# Patient Record
Sex: Male | Born: 1935 | Race: White | Hispanic: No | Smoking: Never smoker
Health system: Southern US, Community
[De-identification: ages and names within clinical notes are randomized; demographics above are authoritative.]

## PROBLEM LIST (undated history)

## (undated) DIAGNOSIS — J189 Pneumonia, unspecified organism: Secondary | ICD-10-CM

## (undated) DIAGNOSIS — Z950 Presence of cardiac pacemaker: Secondary | ICD-10-CM

## (undated) DIAGNOSIS — G4733 Obstructive sleep apnea (adult) (pediatric): Secondary | ICD-10-CM

## (undated) DIAGNOSIS — Z9981 Dependence on supplemental oxygen: Secondary | ICD-10-CM

## (undated) DIAGNOSIS — Z9989 Dependence on other enabling machines and devices: Secondary | ICD-10-CM

## (undated) DIAGNOSIS — I10 Essential (primary) hypertension: Secondary | ICD-10-CM

## (undated) DIAGNOSIS — E7800 Pure hypercholesterolemia, unspecified: Secondary | ICD-10-CM

## (undated) DIAGNOSIS — I509 Heart failure, unspecified: Secondary | ICD-10-CM

## (undated) HISTORY — PX: CATARACT EXTRACTION, BILATERAL: SHX1313

---

## 1989-05-20 HISTORY — PX: BRAIN SURGERY: SHX531

## 1999-05-21 HISTORY — PX: INSERT / REPLACE / REMOVE PACEMAKER: SUR710

## 2005-11-21 ENCOUNTER — Ambulatory Visit: Payer: Self-pay | Admitting: Gastroenterology

## 2009-05-25 ENCOUNTER — Inpatient Hospital Stay: Payer: Self-pay | Admitting: Student

## 2009-06-02 ENCOUNTER — Encounter: Payer: Self-pay | Admitting: Internal Medicine

## 2010-03-02 ENCOUNTER — Ambulatory Visit: Payer: Self-pay | Admitting: Cardiology

## 2010-03-19 ENCOUNTER — Ambulatory Visit: Payer: Self-pay | Admitting: Cardiology

## 2010-04-19 ENCOUNTER — Ambulatory Visit: Payer: Self-pay | Admitting: Cardiology

## 2012-05-25 ENCOUNTER — Other Ambulatory Visit: Payer: Self-pay | Admitting: Ophthalmology

## 2012-05-28 LAB — AEROBIC CULTURE

## 2012-06-15 LAB — CULTURE, FUNGUS WITHOUT SMEAR

## 2013-03-06 ENCOUNTER — Inpatient Hospital Stay: Payer: Self-pay | Admitting: Internal Medicine

## 2013-03-06 LAB — CBC
HCT: 31.9 % — ABNORMAL LOW (ref 40.0–52.0)
MCHC: 34.8 g/dL (ref 32.0–36.0)
MCV: 102 fL — ABNORMAL HIGH (ref 80–100)
RDW: 13.8 % (ref 11.5–14.5)
WBC: 13.6 10*3/uL — ABNORMAL HIGH (ref 3.8–10.6)

## 2013-03-06 LAB — COMPREHENSIVE METABOLIC PANEL
Albumin: 3.8 g/dL (ref 3.4–5.0)
Alkaline Phosphatase: 88 U/L (ref 50–136)
Chloride: 102 mmol/L (ref 98–107)
EGFR (Non-African Amer.): 24 — ABNORMAL LOW
Glucose: 100 mg/dL — ABNORMAL HIGH (ref 65–99)
Osmolality: 285 (ref 275–301)
Potassium: 3.7 mmol/L (ref 3.5–5.1)

## 2013-03-06 LAB — URINALYSIS, COMPLETE
Bacteria: NONE SEEN
Blood: NEGATIVE
Glucose,UR: NEGATIVE mg/dL (ref 0–75)
Leukocyte Esterase: NEGATIVE
Protein: NEGATIVE
RBC,UR: <1 /HPF (ref 0–5)
Specific Gravity: 1.014 (ref 1.003–1.030)

## 2013-03-07 LAB — CBC WITH DIFFERENTIAL/PLATELET
Eosinophil #: 0 10*3/uL (ref 0.0–0.7)
Eosinophil %: 0.3 %
HCT: 27.1 % — ABNORMAL LOW (ref 40.0–52.0)
Lymphocyte %: 12.7 %
MCH: 35.5 pg — ABNORMAL HIGH (ref 26.0–34.0)
MCV: 103 fL — ABNORMAL HIGH (ref 80–100)
Monocyte #: 1.3 x10 3/mm — ABNORMAL HIGH (ref 0.2–1.0)
Monocyte %: 13.2 %
Neutrophil #: 7.2 10*3/uL — ABNORMAL HIGH (ref 1.4–6.5)
Neutrophil %: 73.3 %
RBC: 2.62 10*6/uL — ABNORMAL LOW (ref 4.40–5.90)

## 2013-03-07 LAB — BASIC METABOLIC PANEL
Anion Gap: 6 — ABNORMAL LOW (ref 7–16)
BUN: 43 mg/dL — ABNORMAL HIGH (ref 7–18)
Calcium, Total: 7.6 mg/dL — ABNORMAL LOW (ref 8.5–10.1)
Chloride: 107 mmol/L (ref 98–107)
Creatinine: 1.93 mg/dL — ABNORMAL HIGH (ref 0.60–1.30)
EGFR (African American): 35 — ABNORMAL LOW
EGFR (Non-African Amer.): 30 — ABNORMAL LOW
Glucose: 98 mg/dL (ref 65–99)

## 2013-03-07 LAB — LIPID PANEL
HDL Cholesterol: 36 mg/dL — ABNORMAL LOW (ref 40–60)
Ldl Cholesterol, Calc: 78 mg/dL (ref 0–100)
VLDL Cholesterol, Calc: 10 mg/dL (ref 5–40)

## 2013-03-07 LAB — TROPONIN I
Troponin-I: 0.05 ng/mL
Troponin-I: 0.05 ng/mL

## 2013-03-08 LAB — BASIC METABOLIC PANEL
Anion Gap: 3 — ABNORMAL LOW (ref 7–16)
BUN: 36 mg/dL — ABNORMAL HIGH (ref 7–18)
BUN: 31 mg/dL — ABNORMAL HIGH (ref 7–18)
Calcium, Total: 7.4 mg/dL — ABNORMAL LOW (ref 8.5–10.1)
Calcium, Total: 7.5 mg/dL — ABNORMAL LOW (ref 8.5–10.1)
Co2: 28 mmol/L (ref 21–32)
Creatinine: 1.76 mg/dL — ABNORMAL HIGH (ref 0.60–1.30)
EGFR (African American): 43 — ABNORMAL LOW
EGFR (Non-African Amer.): 33 — ABNORMAL LOW
EGFR (Non-African Amer.): 37 — ABNORMAL LOW
Glucose: 132 mg/dL — ABNORMAL HIGH (ref 65–99)
Potassium: 3.8 mmol/L (ref 3.5–5.1)
Potassium: 4.2 mmol/L (ref 3.5–5.1)
Sodium: 141 mmol/L (ref 136–145)

## 2013-03-29 LAB — CULTURE, BLOOD (SINGLE)

## 2013-10-21 ENCOUNTER — Ambulatory Visit: Payer: Self-pay | Admitting: Internal Medicine

## 2013-10-31 ENCOUNTER — Emergency Department: Payer: Self-pay | Admitting: Internal Medicine

## 2013-10-31 LAB — URINALYSIS, COMPLETE
BACTERIA: NONE SEEN
BILIRUBIN, UR: NEGATIVE
Blood: NEGATIVE
Glucose,UR: NEGATIVE mg/dL (ref 0–75)
KETONE: NEGATIVE
Leukocyte Esterase: NEGATIVE
Nitrite: NEGATIVE
Ph: 7 (ref 4.5–8.0)
Protein: 30
Specific Gravity: 1.006 (ref 1.003–1.030)
Squamous Epithelial: NONE SEEN

## 2013-10-31 LAB — CK TOTAL AND CKMB (NOT AT ARMC)
CK, Total: 77 U/L
CK-MB: 2.8 ng/mL (ref 0.5–3.6)

## 2013-10-31 LAB — CBC
HCT: 39.9 % — ABNORMAL LOW (ref 40.0–52.0)
HGB: 13.4 g/dL (ref 13.0–18.0)
MCH: 35.9 pg — ABNORMAL HIGH (ref 26.0–34.0)
MCHC: 33.5 g/dL (ref 32.0–36.0)
MCV: 107 fL — AB (ref 80–100)
PLATELETS: 125 10*3/uL — AB (ref 150–440)
RBC: 3.73 10*6/uL — ABNORMAL LOW (ref 4.40–5.90)
RDW: 14.6 % — ABNORMAL HIGH (ref 11.5–14.5)
WBC: 6.1 10*3/uL (ref 3.8–10.6)

## 2013-10-31 LAB — PROTIME-INR
INR: 1.4
Prothrombin Time: 16.6 secs — ABNORMAL HIGH (ref 11.5–14.7)

## 2013-10-31 LAB — COMPREHENSIVE METABOLIC PANEL
ALK PHOS: 86 U/L
ALT: 22 U/L (ref 12–78)
AST: 25 U/L (ref 15–37)
Albumin: 3.4 g/dL (ref 3.4–5.0)
Anion Gap: 4 — ABNORMAL LOW (ref 7–16)
BUN: 31 mg/dL — AB (ref 7–18)
Bilirubin,Total: 0.6 mg/dL (ref 0.2–1.0)
CO2: 36 mmol/L — AB (ref 21–32)
CREATININE: 1.63 mg/dL — AB (ref 0.60–1.30)
Calcium, Total: 8.1 mg/dL — ABNORMAL LOW (ref 8.5–10.1)
Chloride: 92 mmol/L — ABNORMAL LOW (ref 98–107)
EGFR (Non-African Amer.): 37 — ABNORMAL LOW
GFR CALC AF AMER: 42 — AB
GLUCOSE: 115 mg/dL — AB (ref 65–99)
Osmolality: 272 (ref 275–301)
Potassium: 4.4 mmol/L (ref 3.5–5.1)
Sodium: 132 mmol/L — ABNORMAL LOW (ref 136–145)
Total Protein: 6.4 g/dL (ref 6.4–8.2)

## 2013-10-31 LAB — TROPONIN I: TROPONIN-I: 0.03 ng/mL

## 2013-10-31 LAB — PRO B NATRIURETIC PEPTIDE: B-TYPE NATIURETIC PEPTID: 19139 pg/mL — AB (ref 0–450)

## 2013-11-17 ENCOUNTER — Ambulatory Visit: Payer: Self-pay | Admitting: Internal Medicine

## 2013-12-18 ENCOUNTER — Ambulatory Visit: Payer: Self-pay | Admitting: Internal Medicine

## 2014-01-12 ENCOUNTER — Inpatient Hospital Stay: Payer: Self-pay | Admitting: Internal Medicine

## 2014-01-12 LAB — URINALYSIS, COMPLETE
Bacteria: NONE SEEN
Bilirubin,UR: NEGATIVE
Blood: NEGATIVE
Glucose,UR: NEGATIVE mg/dL (ref 0–75)
KETONE: NEGATIVE
LEUKOCYTE ESTERASE: NEGATIVE
Nitrite: NEGATIVE
PH: 5 (ref 4.5–8.0)
SQUAMOUS EPITHELIAL: NONE SEEN
Specific Gravity: 1.014 (ref 1.003–1.030)
WBC UR: 1 /HPF (ref 0–5)

## 2014-01-12 LAB — COMPREHENSIVE METABOLIC PANEL
ALBUMIN: 2.8 g/dL — AB (ref 3.4–5.0)
ALT: 19 U/L (ref 12–78)
ANION GAP: 3 — AB (ref 7–16)
Alkaline Phosphatase: 90 U/L
BUN: 36 mg/dL — ABNORMAL HIGH (ref 7–18)
Bilirubin,Total: 0.5 mg/dL (ref 0.2–1.0)
CHLORIDE: 107 mmol/L (ref 98–107)
Calcium, Total: 7 mg/dL — CL (ref 8.5–10.1)
Co2: 37 mmol/L — ABNORMAL HIGH (ref 21–32)
Creatinine: 1.38 mg/dL — ABNORMAL HIGH (ref 0.60–1.30)
EGFR (African American): 51 — ABNORMAL LOW
EGFR (Non-African Amer.): 44 — ABNORMAL LOW
Glucose: 210 mg/dL — ABNORMAL HIGH (ref 65–99)
OSMOLALITY: 307 (ref 275–301)
POTASSIUM: 3.7 mmol/L (ref 3.5–5.1)
SGOT(AST): 27 U/L (ref 15–37)
SODIUM: 147 mmol/L — AB (ref 136–145)
Total Protein: 5.7 g/dL — ABNORMAL LOW (ref 6.4–8.2)

## 2014-01-12 LAB — PRO B NATRIURETIC PEPTIDE: B-Type Natriuretic Peptide: 22016 pg/mL — ABNORMAL HIGH (ref 0–450)

## 2014-01-12 LAB — DIFFERENTIAL
BASOS ABS: 0 10*3/uL (ref 0.0–0.1)
BASOS PCT: 0.6 %
EOS PCT: 0.1 %
Eosinophil #: 0 10*3/uL (ref 0.0–0.7)
LYMPHS ABS: 0.7 10*3/uL — AB (ref 1.0–3.6)
Lymphocyte %: 10.3 %
Monocyte #: 0.5 x10 3/mm (ref 0.2–1.0)
Monocyte %: 7.3 %
NEUTROS PCT: 81.7 %
Neutrophil #: 5.3 10*3/uL (ref 1.4–6.5)

## 2014-01-12 LAB — CBC
HCT: 42.7 % (ref 40.0–52.0)
HGB: 13.8 g/dL (ref 13.0–18.0)
MCH: 36.2 pg — ABNORMAL HIGH (ref 26.0–34.0)
MCHC: 32.4 g/dL (ref 32.0–36.0)
MCV: 112 fL — ABNORMAL HIGH (ref 80–100)
PLATELETS: 100 10*3/uL — AB (ref 150–440)
RBC: 3.83 10*6/uL — ABNORMAL LOW (ref 4.40–5.90)
RDW: 15.2 % — AB (ref 11.5–14.5)
WBC: 6.5 10*3/uL (ref 3.8–10.6)

## 2014-01-12 LAB — TROPONIN I
TROPONIN-I: 0.05 ng/mL
TROPONIN-I: 0.05 ng/mL
Troponin-I: 0.05 ng/mL
Troponin-I: 0.05 ng/mL

## 2014-01-12 LAB — TSH: THYROID STIMULATING HORM: 0.588 u[IU]/mL

## 2014-01-12 LAB — CARBAMAZEPINE LEVEL, TOTAL: Carbamazepine: 7.5 ug/mL (ref 4.0–12.0)

## 2014-01-13 LAB — CBC WITH DIFFERENTIAL/PLATELET
Basophil #: 0 10*3/uL (ref 0.0–0.1)
Basophil %: 0.4 %
Eosinophil #: 0.1 10*3/uL (ref 0.0–0.7)
Eosinophil %: 1.3 %
HCT: 40.7 % (ref 40.0–52.0)
HGB: 13.6 g/dL (ref 13.0–18.0)
LYMPHS PCT: 8 %
Lymphocyte #: 0.5 10*3/uL — ABNORMAL LOW (ref 1.0–3.6)
MCH: 36.6 pg — ABNORMAL HIGH (ref 26.0–34.0)
MCHC: 33.5 g/dL (ref 32.0–36.0)
MCV: 109 fL — ABNORMAL HIGH (ref 80–100)
MONO ABS: 0.6 x10 3/mm (ref 0.2–1.0)
Monocyte %: 9.6 %
Neutrophil #: 5.5 10*3/uL (ref 1.4–6.5)
Neutrophil %: 80.7 %
Platelet: 89 10*3/uL — ABNORMAL LOW (ref 150–440)
RBC: 3.73 10*6/uL — ABNORMAL LOW (ref 4.40–5.90)
RDW: 14.9 % — AB (ref 11.5–14.5)
WBC: 6.8 10*3/uL (ref 3.8–10.6)

## 2014-01-13 LAB — BASIC METABOLIC PANEL
Anion Gap: 1 — ABNORMAL LOW (ref 7–16)
BUN: 38 mg/dL — ABNORMAL HIGH (ref 7–18)
Calcium, Total: 7.9 mg/dL — ABNORMAL LOW (ref 8.5–10.1)
Chloride: 101 mmol/L (ref 98–107)
Co2: 42 mmol/L (ref 21–32)
Creatinine: 1.46 mg/dL — ABNORMAL HIGH (ref 0.60–1.30)
EGFR (Non-African Amer.): 41 — ABNORMAL LOW
GFR CALC AF AMER: 48 — AB
GLUCOSE: 97 mg/dL (ref 65–99)
Osmolality: 296 (ref 275–301)
POTASSIUM: 3.4 mmol/L — AB (ref 3.5–5.1)
SODIUM: 144 mmol/L (ref 136–145)

## 2014-01-14 LAB — LACTATE DEHYDROGENASE, PLEURAL OR PERITONEAL FLUID: LDH, BODY FLUID: 63 U/L

## 2014-01-14 LAB — BODY FLUID CELL COUNT WITH DIFFERENTIAL
Basophil: 0 %
Eosinophil: 0 %
Lymphocytes: 10 %
NEUTROS PCT: 72 %
NUCLEATED CELL COUNT: 155 /mm3
OTHER MONONUCLEAR CELLS: 18 %
Other Cells BF: 0 %

## 2014-01-14 LAB — APTT: Activated PTT: 32.3 secs (ref 23.6–35.9)

## 2014-01-14 LAB — ALBUMIN, FLUID (OTHER): Body Fluid Albumin: 1.4 g/dL

## 2014-01-14 LAB — AMYLASE, BODY FLUID: AMYLASE, BODY FLUID: 10 U/L

## 2014-01-14 LAB — PROTIME-INR
INR: 1.3
Prothrombin Time: 16.1 secs — ABNORMAL HIGH (ref 11.5–14.7)

## 2014-01-14 LAB — GLUCOSE, SEROUS FLUID: Glucose, Body Fluid: 106 mg/dL

## 2014-01-14 LAB — PROTEIN, BODY FLUID: Protein, Body Fluid: 2.1 g/dL

## 2014-01-15 LAB — BASIC METABOLIC PANEL
Anion Gap: 3 — ABNORMAL LOW (ref 7–16)
BUN: 37 mg/dL — AB (ref 7–18)
CALCIUM: 8.3 mg/dL — AB (ref 8.5–10.1)
CREATININE: 1.43 mg/dL — AB (ref 0.60–1.30)
Chloride: 98 mmol/L (ref 98–107)
Co2: 42 mmol/L (ref 21–32)
EGFR (Non-African Amer.): 43 — ABNORMAL LOW
GFR CALC AF AMER: 49 — AB
Glucose: 94 mg/dL (ref 65–99)
OSMOLALITY: 293 (ref 275–301)
POTASSIUM: 3.4 mmol/L — AB (ref 3.5–5.1)
SODIUM: 143 mmol/L (ref 136–145)

## 2014-01-15 LAB — CBC WITH DIFFERENTIAL/PLATELET
BASOS ABS: 0 10*3/uL (ref 0.0–0.1)
BASOS PCT: 0.5 %
EOS PCT: 0.4 %
Eosinophil #: 0 10*3/uL (ref 0.0–0.7)
HCT: 40.9 % (ref 40.0–52.0)
HGB: 13.6 g/dL (ref 13.0–18.0)
Lymphocyte #: 0.6 10*3/uL — ABNORMAL LOW (ref 1.0–3.6)
Lymphocyte %: 9.7 %
MCH: 36.3 pg — AB (ref 26.0–34.0)
MCHC: 33.2 g/dL (ref 32.0–36.0)
MCV: 109 fL — AB (ref 80–100)
MONO ABS: 0.6 x10 3/mm (ref 0.2–1.0)
MONOS PCT: 9.6 %
Neutrophil #: 4.7 10*3/uL (ref 1.4–6.5)
Neutrophil %: 79.8 %
PLATELETS: 92 10*3/uL — AB (ref 150–440)
RBC: 3.74 10*6/uL — AB (ref 4.40–5.90)
RDW: 14.3 % (ref 11.5–14.5)
WBC: 5.9 10*3/uL (ref 3.8–10.6)

## 2014-01-15 LAB — LACTATE DEHYDROGENASE: LDH: 167 U/L (ref 85–241)

## 2014-01-15 LAB — ALBUMIN: Albumin: 2.8 g/dL — ABNORMAL LOW (ref 3.4–5.0)

## 2014-01-15 LAB — PROTEIN, TOTAL: Total Protein: 5.9 g/dL — ABNORMAL LOW (ref 6.4–8.2)

## 2014-01-16 LAB — FOLATE: FOLIC ACID: 28.3 ng/mL (ref 3.1–100.0)

## 2014-01-16 LAB — PLATELET COUNT: PLATELETS: 103 10*3/uL — AB (ref 150–440)

## 2014-01-17 ENCOUNTER — Ambulatory Visit: Payer: Self-pay | Admitting: Internal Medicine

## 2014-01-17 LAB — CULTURE, BLOOD (SINGLE)

## 2014-01-18 LAB — BASIC METABOLIC PANEL
ANION GAP: 3 — AB (ref 7–16)
BUN: 40 mg/dL — AB (ref 7–18)
CALCIUM: 8 mg/dL — AB (ref 8.5–10.1)
CHLORIDE: 98 mmol/L (ref 98–107)
CO2: 37 mmol/L — AB (ref 21–32)
Creatinine: 1.37 mg/dL — ABNORMAL HIGH (ref 0.60–1.30)
EGFR (African American): 52 — ABNORMAL LOW
GFR CALC NON AF AMER: 45 — AB
GLUCOSE: 122 mg/dL — AB (ref 65–99)
Osmolality: 287 (ref 275–301)
Potassium: 3.8 mmol/L (ref 3.5–5.1)
Sodium: 138 mmol/L (ref 136–145)

## 2014-01-18 LAB — CBC WITH DIFFERENTIAL/PLATELET
BASOS ABS: 0 10*3/uL (ref 0.0–0.1)
Basophil %: 0.3 %
EOS PCT: 0.4 %
Eosinophil #: 0 10*3/uL (ref 0.0–0.7)
HCT: 38.5 % — ABNORMAL LOW (ref 40.0–52.0)
HGB: 12.5 g/dL — AB (ref 13.0–18.0)
LYMPHS ABS: 0.4 10*3/uL — AB (ref 1.0–3.6)
Lymphocyte %: 7.1 %
MCH: 35.7 pg — ABNORMAL HIGH (ref 26.0–34.0)
MCHC: 32.6 g/dL (ref 32.0–36.0)
MCV: 109 fL — AB (ref 80–100)
Monocyte #: 0.5 x10 3/mm (ref 0.2–1.0)
Monocyte %: 8.6 %
NEUTROS PCT: 83.6 %
Neutrophil #: 4.8 10*3/uL (ref 1.4–6.5)
Platelet: 106 10*3/uL — ABNORMAL LOW (ref 150–440)
RBC: 3.52 10*6/uL — AB (ref 4.40–5.90)
RDW: 14.5 % (ref 11.5–14.5)
WBC: 5.7 10*3/uL (ref 3.8–10.6)

## 2014-01-19 LAB — CBC WITH DIFFERENTIAL/PLATELET
Basophil #: 0 10*3/uL (ref 0.0–0.1)
Basophil %: 0.5 %
EOS ABS: 0.1 10*3/uL (ref 0.0–0.7)
Eosinophil %: 1.4 %
HCT: 37 % — ABNORMAL LOW (ref 40.0–52.0)
HGB: 11.9 g/dL — AB (ref 13.0–18.0)
LYMPHS ABS: 0.5 10*3/uL — AB (ref 1.0–3.6)
Lymphocyte %: 9.9 %
MCH: 35.3 pg — ABNORMAL HIGH (ref 26.0–34.0)
MCHC: 32.3 g/dL (ref 32.0–36.0)
MCV: 110 fL — ABNORMAL HIGH (ref 80–100)
Monocyte #: 0.6 x10 3/mm (ref 0.2–1.0)
Monocyte %: 13.7 %
Neutrophil #: 3.4 10*3/uL (ref 1.4–6.5)
Neutrophil %: 74.5 %
Platelet: 120 10*3/uL — ABNORMAL LOW (ref 150–440)
RBC: 3.38 10*6/uL — ABNORMAL LOW (ref 4.40–5.90)
RDW: 14.2 % (ref 11.5–14.5)
WBC: 4.6 10*3/uL (ref 3.8–10.6)

## 2014-01-19 LAB — BASIC METABOLIC PANEL
Anion Gap: 0 — ABNORMAL LOW (ref 7–16)
BUN: 41 mg/dL — ABNORMAL HIGH (ref 7–18)
CALCIUM: 8.1 mg/dL — AB (ref 8.5–10.1)
CO2: 42 mmol/L — AB (ref 21–32)
Chloride: 98 mmol/L (ref 98–107)
Creatinine: 1.44 mg/dL — ABNORMAL HIGH (ref 0.60–1.30)
EGFR (Non-African Amer.): 42 — ABNORMAL LOW
GFR CALC AF AMER: 49 — AB
Glucose: 110 mg/dL — ABNORMAL HIGH (ref 65–99)
Osmolality: 290 (ref 275–301)
Potassium: 3.8 mmol/L (ref 3.5–5.1)
SODIUM: 140 mmol/L (ref 136–145)

## 2014-01-19 LAB — BODY FLUID CULTURE

## 2014-01-21 LAB — HEMOGLOBIN: HGB: 12.5 g/dL — AB (ref 13.0–18.0)

## 2014-01-22 ENCOUNTER — Ambulatory Visit (HOSPITAL_COMMUNITY)
Admission: AD | Admit: 2014-01-22 | Discharge: 2014-01-22 | Disposition: A | Discharge status: Home / Self Care | Payer: Medicare Other | Source: Other Acute Inpatient Hospital | Attending: Internal Medicine | Admitting: Internal Medicine

## 2014-01-22 ENCOUNTER — Inpatient Hospital Stay
Admission: AD | Admit: 2014-01-22 | Discharge: 2014-02-14 | Disposition: A | Discharge status: Home Health | Payer: Self-pay | Source: Ambulatory Visit | Attending: Internal Medicine | Admitting: Internal Medicine

## 2014-01-22 DIAGNOSIS — R0609 Other forms of dyspnea: Secondary | ICD-10-CM | POA: Insufficient documentation

## 2014-01-22 DIAGNOSIS — R0989 Other specified symptoms and signs involving the circulatory and respiratory systems: Principal | ICD-10-CM | POA: Insufficient documentation

## 2014-01-22 LAB — COMPREHENSIVE METABOLIC PANEL
AST: 18 U/L (ref 15–37)
Albumin: 2.2 g/dL — ABNORMAL LOW (ref 3.4–5.0)
Alkaline Phosphatase: 100 U/L
Anion Gap: 0 — ABNORMAL LOW (ref 7–16)
BUN: 33 mg/dL — ABNORMAL HIGH (ref 7–18)
Bilirubin,Total: 0.6 mg/dL (ref 0.2–1.0)
CREATININE: 1.51 mg/dL — AB (ref 0.60–1.30)
Calcium, Total: 7.9 mg/dL — ABNORMAL LOW (ref 8.5–10.1)
Chloride: 100 mmol/L (ref 98–107)
Co2: 42 mmol/L (ref 21–32)
EGFR (African American): 46 — ABNORMAL LOW
EGFR (Non-African Amer.): 40 — ABNORMAL LOW
Glucose: 90 mg/dL (ref 65–99)
Osmolality: 290 (ref 275–301)
POTASSIUM: 3.4 mmol/L — AB (ref 3.5–5.1)
SGPT (ALT): 12 U/L (ref 12–78)
Sodium: 142 mmol/L (ref 136–145)
Total Protein: 5.6 g/dL — ABNORMAL LOW (ref 6.4–8.2)

## 2014-01-22 LAB — CBC WITH DIFFERENTIAL/PLATELET
BASOS PCT: 0.2 %
Basophil #: 0 10*3/uL (ref 0.0–0.1)
Eosinophil #: 0 10*3/uL (ref 0.0–0.7)
Eosinophil %: 0.5 %
HCT: 36.5 % — AB (ref 40.0–52.0)
HGB: 12.1 g/dL — ABNORMAL LOW (ref 13.0–18.0)
Lymphocyte #: 0.6 10*3/uL — ABNORMAL LOW (ref 1.0–3.6)
Lymphocyte %: 10.4 %
MCH: 36.4 pg — AB (ref 26.0–34.0)
MCHC: 33.3 g/dL (ref 32.0–36.0)
MCV: 109 fL — AB (ref 80–100)
MONOS PCT: 12.4 %
Monocyte #: 0.8 x10 3/mm (ref 0.2–1.0)
NEUTROS PCT: 76.5 %
Neutrophil #: 4.6 10*3/uL (ref 1.4–6.5)
PLATELETS: 114 10*3/uL — AB (ref 150–440)
RBC: 3.34 10*6/uL — AB (ref 4.40–5.90)
RDW: 14.2 % (ref 11.5–14.5)
WBC: 6 10*3/uL (ref 3.8–10.6)

## 2014-01-23 ENCOUNTER — Other Ambulatory Visit (HOSPITAL_COMMUNITY): Payer: Self-pay

## 2014-01-23 LAB — CBC WITH DIFFERENTIAL/PLATELET
BASOS PCT: 0 % (ref 0–1)
Basophils Absolute: 0 10*3/uL (ref 0.0–0.1)
EOS PCT: 1 % (ref 0–5)
Eosinophils Absolute: 0.1 10*3/uL (ref 0.0–0.7)
HEMATOCRIT: 40.9 % (ref 39.0–52.0)
HEMOGLOBIN: 13.3 g/dL (ref 13.0–17.0)
LYMPHS ABS: 0.8 10*3/uL (ref 0.7–4.0)
Lymphocytes Relative: 14 % (ref 12–46)
MCH: 35.8 pg — ABNORMAL HIGH (ref 26.0–34.0)
MCHC: 32.5 g/dL (ref 30.0–36.0)
MCV: 110.2 fL — AB (ref 78.0–100.0)
MONOS PCT: 9 % (ref 3–12)
Monocytes Absolute: 0.5 10*3/uL (ref 0.1–1.0)
NEUTROS ABS: 4.1 10*3/uL (ref 1.7–7.7)
Neutrophils Relative %: 76 % (ref 43–77)
Platelets: 141 10*3/uL — ABNORMAL LOW (ref 150–400)
RBC: 3.71 MIL/uL — AB (ref 4.22–5.81)
RDW: 14.6 % (ref 11.5–15.5)
WBC: 5.5 10*3/uL (ref 4.0–10.5)

## 2014-01-23 LAB — COMPREHENSIVE METABOLIC PANEL
ALT: 11 U/L (ref 0–53)
AST: 18 U/L (ref 0–37)
Albumin: 2.7 g/dL — ABNORMAL LOW (ref 3.5–5.2)
Alkaline Phosphatase: 109 U/L (ref 39–117)
BUN: 31 mg/dL — AB (ref 6–23)
CHLORIDE: 99 meq/L (ref 96–112)
CO2: 42 mEq/L (ref 19–32)
CREATININE: 1.29 mg/dL (ref 0.50–1.35)
Calcium: 8.5 mg/dL (ref 8.4–10.5)
GFR calc Af Amer: 54 mL/min — ABNORMAL LOW (ref >90)
GFR calc non Af Amer: 47 mL/min — ABNORMAL LOW (ref >90)
GLUCOSE: 106 mg/dL — AB (ref 70–99)
Potassium: 4.3 mEq/L (ref 3.7–5.3)
Sodium: 146 mEq/L (ref 137–147)
Total Bilirubin: 0.6 mg/dL (ref 0.3–1.2)
Total Protein: 6.4 g/dL (ref 6.0–8.3)

## 2014-01-23 LAB — BLOOD GAS, ARTERIAL
Acid-Base Excess: 17.4 mmol/L — ABNORMAL HIGH (ref 0.0–2.0)
BICARBONATE: 43.2 meq/L — AB (ref 20.0–24.0)
O2 Content: 5 L/min
O2 Saturation: 99.8 %
PATIENT TEMPERATURE: 98.6
PH ART: 7.408 (ref 7.350–7.450)
TCO2: 45.3 mmol/L (ref 0–100)
pCO2 arterial: 69.8 mmHg (ref 35.0–45.0)
pO2, Arterial: 167 mmHg — ABNORMAL HIGH (ref 80.0–100.0)

## 2014-01-23 LAB — PHOSPHORUS: PHOSPHORUS: 2.5 mg/dL (ref 2.3–4.6)

## 2014-01-23 LAB — PROCALCITONIN: Procalcitonin: <0.1 ng/mL

## 2014-01-23 LAB — T4, FREE: Free T4: 0.78 ng/dL — ABNORMAL LOW (ref 0.80–1.80)

## 2014-01-23 LAB — TSH: TSH: 1.36 u[IU]/mL (ref 0.350–4.500)

## 2014-01-23 LAB — VITAMIN B12: Vitamin B-12: 1134 pg/mL — ABNORMAL HIGH (ref 211–911)

## 2014-01-23 LAB — PREALBUMIN: Prealbumin: 9.7 mg/dL — ABNORMAL LOW (ref 17.0–34.0)

## 2014-01-23 LAB — MAGNESIUM: MAGNESIUM: 2.4 mg/dL (ref 1.5–2.5)

## 2014-01-24 LAB — FOLATE RBC: RBC FOLATE: 920 ng/mL — AB (ref >280)

## 2014-01-25 LAB — BASIC METABOLIC PANEL
BUN: 35 mg/dL — AB (ref 6–23)
CHLORIDE: 101 meq/L (ref 96–112)
CO2: 38 mEq/L — ABNORMAL HIGH (ref 19–32)
CREATININE: 1.2 mg/dL (ref 0.50–1.35)
Calcium: 7.9 mg/dL — ABNORMAL LOW (ref 8.4–10.5)
GFR calc non Af Amer: 51 mL/min — ABNORMAL LOW (ref >90)
GFR, EST AFRICAN AMERICAN: 59 mL/min — AB (ref >90)
Glucose, Bld: 80 mg/dL (ref 70–99)
Potassium: 4.5 mEq/L (ref 3.7–5.3)
Sodium: 145 mEq/L (ref 137–147)

## 2014-01-25 LAB — CBC
HCT: 37.2 % — ABNORMAL LOW (ref 39.0–52.0)
HEMOGLOBIN: 12 g/dL — AB (ref 13.0–17.0)
MCH: 35.6 pg — ABNORMAL HIGH (ref 26.0–34.0)
MCHC: 32.3 g/dL (ref 30.0–36.0)
MCV: 110.4 fL — AB (ref 78.0–100.0)
Platelets: 156 10*3/uL (ref 150–400)
RBC: 3.37 MIL/uL — ABNORMAL LOW (ref 4.22–5.81)
RDW: 14.6 % (ref 11.5–15.5)
WBC: 5.6 10*3/uL (ref 4.0–10.5)

## 2014-01-25 LAB — VANCOMYCIN, TROUGH: Vancomycin Tr: 7.3 ug/mL — ABNORMAL LOW (ref 10.0–20.0)

## 2014-01-26 ENCOUNTER — Other Ambulatory Visit (HOSPITAL_COMMUNITY): Payer: Self-pay

## 2014-01-27 ENCOUNTER — Other Ambulatory Visit (HOSPITAL_COMMUNITY): Payer: Self-pay

## 2014-01-27 LAB — CBC WITH DIFFERENTIAL/PLATELET
BASOS PCT: 1 % (ref 0–1)
Basophils Absolute: 0 10*3/uL (ref 0.0–0.1)
Eosinophils Absolute: 0 10*3/uL (ref 0.0–0.7)
Eosinophils Relative: 0 % (ref 0–5)
HCT: 38.7 % — ABNORMAL LOW (ref 39.0–52.0)
HEMOGLOBIN: 12.7 g/dL — AB (ref 13.0–17.0)
Lymphocytes Relative: 18 % (ref 12–46)
Lymphs Abs: 0.9 10*3/uL (ref 0.7–4.0)
MCH: 35.3 pg — AB (ref 26.0–34.0)
MCHC: 32.8 g/dL (ref 30.0–36.0)
MCV: 107.5 fL — ABNORMAL HIGH (ref 78.0–100.0)
Monocytes Absolute: 0.5 10*3/uL (ref 0.1–1.0)
Monocytes Relative: 10 % (ref 3–12)
Neutro Abs: 3.4 10*3/uL (ref 1.7–7.7)
Neutrophils Relative %: 71 % (ref 43–77)
Platelets: 149 10*3/uL — ABNORMAL LOW (ref 150–400)
RBC: 3.6 MIL/uL — ABNORMAL LOW (ref 4.22–5.81)
RDW: 14.4 % (ref 11.5–15.5)
WBC: 4.8 10*3/uL (ref 4.0–10.5)

## 2014-01-27 LAB — PREALBUMIN: Prealbumin: 11.6 mg/dL — ABNORMAL LOW (ref 17.0–34.0)

## 2014-01-27 LAB — BASIC METABOLIC PANEL
BUN: 30 mg/dL — ABNORMAL HIGH (ref 6–23)
CO2: 36 mEq/L — ABNORMAL HIGH (ref 19–32)
CREATININE: 1.31 mg/dL (ref 0.50–1.35)
Calcium: 8 mg/dL — ABNORMAL LOW (ref 8.4–10.5)
Chloride: 101 mEq/L (ref 96–112)
GFR calc Af Amer: 53 mL/min — ABNORMAL LOW (ref >90)
GFR, EST NON AFRICAN AMERICAN: 46 mL/min — AB (ref >90)
GLUCOSE: 93 mg/dL (ref 70–99)
Potassium: 4.4 mEq/L (ref 3.7–5.3)
Sodium: 145 mEq/L (ref 137–147)

## 2014-01-27 LAB — MAGNESIUM: Magnesium: 2.4 mg/dL (ref 1.5–2.5)

## 2014-01-27 LAB — PHOSPHORUS: Phosphorus: 2.6 mg/dL (ref 2.3–4.6)

## 2014-01-28 ENCOUNTER — Other Ambulatory Visit (HOSPITAL_COMMUNITY): Payer: Self-pay

## 2014-01-28 LAB — BASIC METABOLIC PANEL
BUN: 32 mg/dL — ABNORMAL HIGH (ref 6–23)
CALCIUM: 8 mg/dL — AB (ref 8.4–10.5)
CO2: 33 mEq/L — ABNORMAL HIGH (ref 19–32)
Chloride: 102 mEq/L (ref 96–112)
Creatinine, Ser: 1.3 mg/dL (ref 0.50–1.35)
GFR calc Af Amer: 54 mL/min — ABNORMAL LOW (ref >90)
GFR calc non Af Amer: 46 mL/min — ABNORMAL LOW (ref >90)
GLUCOSE: 90 mg/dL (ref 70–99)
POTASSIUM: 4.8 meq/L (ref 3.7–5.3)
SODIUM: 144 meq/L (ref 137–147)

## 2014-01-28 LAB — MISCELLANEOUS TEST

## 2014-01-28 LAB — CBC WITH DIFFERENTIAL/PLATELET
BASOS ABS: 0 10*3/uL (ref 0.0–0.1)
Basophils Relative: 0 % (ref 0–1)
EOS PCT: 1 % (ref 0–5)
Eosinophils Absolute: 0 10*3/uL (ref 0.0–0.7)
HCT: 37.9 % — ABNORMAL LOW (ref 39.0–52.0)
Hemoglobin: 12.4 g/dL — ABNORMAL LOW (ref 13.0–17.0)
Lymphocytes Relative: 14 % (ref 12–46)
Lymphs Abs: 0.9 10*3/uL (ref 0.7–4.0)
MCH: 35.1 pg — ABNORMAL HIGH (ref 26.0–34.0)
MCHC: 32.7 g/dL (ref 30.0–36.0)
MCV: 107.4 fL — ABNORMAL HIGH (ref 78.0–100.0)
Monocytes Absolute: 0.7 10*3/uL (ref 0.1–1.0)
Monocytes Relative: 12 % (ref 3–12)
Neutro Abs: 4.5 10*3/uL (ref 1.7–7.7)
Neutrophils Relative %: 73 % (ref 43–77)
Platelets: 166 10*3/uL (ref 150–400)
RBC: 3.53 MIL/uL — ABNORMAL LOW (ref 4.22–5.81)
RDW: 14.6 % (ref 11.5–15.5)
WBC: 6.1 10*3/uL (ref 4.0–10.5)

## 2014-01-29 LAB — BASIC METABOLIC PANEL
BUN: 31 mg/dL — ABNORMAL HIGH (ref 6–23)
CHLORIDE: 103 meq/L (ref 96–112)
CO2: 36 mEq/L — ABNORMAL HIGH (ref 19–32)
CREATININE: 1.37 mg/dL — AB (ref 0.50–1.35)
Calcium: 8.2 mg/dL — ABNORMAL LOW (ref 8.4–10.5)
GFR calc non Af Amer: 43 mL/min — ABNORMAL LOW (ref >90)
GFR, EST AFRICAN AMERICAN: 50 mL/min — AB (ref >90)
Glucose, Bld: 96 mg/dL (ref 70–99)
Potassium: 4.1 mEq/L (ref 3.7–5.3)
SODIUM: 146 meq/L (ref 137–147)

## 2014-01-30 ENCOUNTER — Other Ambulatory Visit (HOSPITAL_COMMUNITY): Payer: Medicare Other

## 2014-01-30 LAB — CBC WITH DIFFERENTIAL/PLATELET
BASOS ABS: 0 10*3/uL (ref 0.0–0.1)
Basophils Relative: 0 % (ref 0–1)
Eosinophils Absolute: 0.1 10*3/uL (ref 0.0–0.7)
Eosinophils Relative: 1 % (ref 0–5)
HCT: 36.1 % — ABNORMAL LOW (ref 39.0–52.0)
Hemoglobin: 12.2 g/dL — ABNORMAL LOW (ref 13.0–17.0)
LYMPHS ABS: 0.8 10*3/uL (ref 0.7–4.0)
LYMPHS PCT: 15 % (ref 12–46)
MCH: 35.7 pg — ABNORMAL HIGH (ref 26.0–34.0)
MCHC: 33.8 g/dL (ref 30.0–36.0)
MCV: 105.6 fL — ABNORMAL HIGH (ref 78.0–100.0)
Monocytes Absolute: 0.6 10*3/uL (ref 0.1–1.0)
Monocytes Relative: 10 % (ref 3–12)
NEUTROS ABS: 4.2 10*3/uL (ref 1.7–7.7)
Neutrophils Relative %: 74 % (ref 43–77)
PLATELETS: 156 10*3/uL (ref 150–400)
RBC: 3.42 MIL/uL — AB (ref 4.22–5.81)
RDW: 14.6 % (ref 11.5–15.5)
WBC: 5.7 10*3/uL (ref 4.0–10.5)

## 2014-01-30 LAB — BASIC METABOLIC PANEL
BUN: 33 mg/dL — ABNORMAL HIGH (ref 6–23)
CHLORIDE: 104 meq/L (ref 96–112)
CO2: 33 meq/L — AB (ref 19–32)
Calcium: 8.1 mg/dL — ABNORMAL LOW (ref 8.4–10.5)
Creatinine, Ser: 1.34 mg/dL (ref 0.50–1.35)
GFR calc Af Amer: 52 mL/min — ABNORMAL LOW (ref >90)
GFR calc non Af Amer: 45 mL/min — ABNORMAL LOW (ref >90)
Glucose, Bld: 94 mg/dL (ref 70–99)
POTASSIUM: 4.3 meq/L (ref 3.7–5.3)
SODIUM: 144 meq/L (ref 137–147)

## 2014-02-03 LAB — COMPREHENSIVE METABOLIC PANEL
ALT: 10 U/L (ref 0–53)
AST: 15 U/L (ref 0–37)
Albumin: 2.5 g/dL — ABNORMAL LOW (ref 3.5–5.2)
Alkaline Phosphatase: 88 U/L (ref 39–117)
BUN: 37 mg/dL — ABNORMAL HIGH (ref 6–23)
CHLORIDE: 100 meq/L (ref 96–112)
CO2: 35 mEq/L — ABNORMAL HIGH (ref 19–32)
Calcium: 8 mg/dL — ABNORMAL LOW (ref 8.4–10.5)
Creatinine, Ser: 1.54 mg/dL — ABNORMAL HIGH (ref 0.50–1.35)
GFR calc Af Amer: 44 mL/min — ABNORMAL LOW (ref >90)
GFR calc non Af Amer: 38 mL/min — ABNORMAL LOW (ref >90)
Glucose, Bld: 93 mg/dL (ref 70–99)
Potassium: 4.5 mEq/L (ref 3.7–5.3)
Sodium: 141 mEq/L (ref 137–147)
Total Bilirubin: 0.6 mg/dL (ref 0.3–1.2)
Total Protein: 5.5 g/dL — ABNORMAL LOW (ref 6.0–8.3)

## 2014-02-03 LAB — CBC
HEMATOCRIT: 34.6 % — AB (ref 39.0–52.0)
HEMOGLOBIN: 11.5 g/dL — AB (ref 13.0–17.0)
MCH: 35.5 pg — ABNORMAL HIGH (ref 26.0–34.0)
MCHC: 33.2 g/dL (ref 30.0–36.0)
MCV: 106.8 fL — ABNORMAL HIGH (ref 78.0–100.0)
PLATELETS: 158 10*3/uL (ref 150–400)
RBC: 3.24 MIL/uL — ABNORMAL LOW (ref 4.22–5.81)
RDW: 14.7 % (ref 11.5–15.5)
WBC: 5.3 10*3/uL (ref 4.0–10.5)

## 2014-02-03 LAB — PREALBUMIN: Prealbumin: 14.2 mg/dL — ABNORMAL LOW (ref 17.0–34.0)

## 2014-02-04 LAB — BASIC METABOLIC PANEL
BUN: 38 mg/dL — AB (ref 6–23)
CALCIUM: 7.9 mg/dL — AB (ref 8.4–10.5)
CO2: 33 mEq/L — ABNORMAL HIGH (ref 19–32)
Chloride: 102 mEq/L (ref 96–112)
Creatinine, Ser: 1.47 mg/dL — ABNORMAL HIGH (ref 0.50–1.35)
GFR calc Af Amer: 46 mL/min — ABNORMAL LOW (ref >90)
GFR calc non Af Amer: 40 mL/min — ABNORMAL LOW (ref >90)
GLUCOSE: 103 mg/dL — AB (ref 70–99)
Potassium: 4.7 mEq/L (ref 3.7–5.3)
SODIUM: 142 meq/L (ref 137–147)

## 2014-02-05 LAB — BASIC METABOLIC PANEL
BUN: 38 mg/dL — ABNORMAL HIGH (ref 6–23)
CO2: 34 mEq/L — ABNORMAL HIGH (ref 19–32)
CREATININE: 1.4 mg/dL — AB (ref 0.50–1.35)
Calcium: 7.8 mg/dL — ABNORMAL LOW (ref 8.4–10.5)
Chloride: 100 mEq/L (ref 96–112)
GFR, EST AFRICAN AMERICAN: 49 mL/min — AB (ref >90)
GFR, EST NON AFRICAN AMERICAN: 42 mL/min — AB (ref >90)
Glucose, Bld: 97 mg/dL (ref 70–99)
POTASSIUM: 4.6 meq/L (ref 3.7–5.3)
Sodium: 139 mEq/L (ref 137–147)

## 2014-02-06 LAB — BASIC METABOLIC PANEL
BUN: 43 mg/dL — ABNORMAL HIGH (ref 6–23)
CO2: 31 mEq/L (ref 19–32)
Calcium: 8 mg/dL — ABNORMAL LOW (ref 8.4–10.5)
Chloride: 102 mEq/L (ref 96–112)
Creatinine, Ser: 1.49 mg/dL — ABNORMAL HIGH (ref 0.50–1.35)
GFR, EST AFRICAN AMERICAN: 45 mL/min — AB (ref >90)
GFR, EST NON AFRICAN AMERICAN: 39 mL/min — AB (ref >90)
Glucose, Bld: 96 mg/dL (ref 70–99)
POTASSIUM: 4.6 meq/L (ref 3.7–5.3)
SODIUM: 142 meq/L (ref 137–147)

## 2014-02-06 LAB — CBC
HCT: 33.5 % — ABNORMAL LOW (ref 39.0–52.0)
Hemoglobin: 11.2 g/dL — ABNORMAL LOW (ref 13.0–17.0)
MCH: 35.4 pg — ABNORMAL HIGH (ref 26.0–34.0)
MCHC: 33.4 g/dL (ref 30.0–36.0)
MCV: 106 fL — ABNORMAL HIGH (ref 78.0–100.0)
Platelets: 168 10*3/uL (ref 150–400)
RBC: 3.16 MIL/uL — ABNORMAL LOW (ref 4.22–5.81)
RDW: 14.6 % (ref 11.5–15.5)
WBC: 4.9 10*3/uL (ref 4.0–10.5)

## 2014-02-07 LAB — BASIC METABOLIC PANEL
BUN: 45 mg/dL — ABNORMAL HIGH (ref 6–23)
CHLORIDE: 101 meq/L (ref 96–112)
CO2: 33 mEq/L — ABNORMAL HIGH (ref 19–32)
CREATININE: 1.47 mg/dL — AB (ref 0.50–1.35)
Calcium: 7.7 mg/dL — ABNORMAL LOW (ref 8.4–10.5)
GFR calc non Af Amer: 40 mL/min — ABNORMAL LOW (ref >90)
GFR, EST AFRICAN AMERICAN: 46 mL/min — AB (ref >90)
Glucose, Bld: 86 mg/dL (ref 70–99)
Potassium: 4.4 mEq/L (ref 3.7–5.3)
Sodium: 140 mEq/L (ref 137–147)

## 2014-02-10 LAB — CBC WITH DIFFERENTIAL/PLATELET
BASOS ABS: 0 10*3/uL (ref 0.0–0.1)
Basophils Relative: 0 % (ref 0–1)
Eosinophils Absolute: 0.1 10*3/uL (ref 0.0–0.7)
Eosinophils Relative: 3 % (ref 0–5)
HEMATOCRIT: 32.8 % — AB (ref 39.0–52.0)
Hemoglobin: 11.3 g/dL — ABNORMAL LOW (ref 13.0–17.0)
LYMPHS PCT: 17 % (ref 12–46)
Lymphs Abs: 1 10*3/uL (ref 0.7–4.0)
MCH: 36.1 pg — ABNORMAL HIGH (ref 26.0–34.0)
MCHC: 34.5 g/dL (ref 30.0–36.0)
MCV: 104.8 fL — ABNORMAL HIGH (ref 78.0–100.0)
MONO ABS: 0.8 10*3/uL (ref 0.1–1.0)
Monocytes Relative: 15 % — ABNORMAL HIGH (ref 3–12)
NEUTROS ABS: 3.6 10*3/uL (ref 1.7–7.7)
NEUTROS PCT: 65 % (ref 43–77)
PLATELETS: 184 10*3/uL (ref 150–400)
RBC: 3.13 MIL/uL — ABNORMAL LOW (ref 4.22–5.81)
RDW: 15 % (ref 11.5–15.5)
WBC: 5.6 10*3/uL (ref 4.0–10.5)

## 2014-02-10 LAB — PREALBUMIN: Prealbumin: 15.8 mg/dL — ABNORMAL LOW (ref 17.0–34.0)

## 2014-02-10 LAB — BASIC METABOLIC PANEL
BUN: 51 mg/dL — ABNORMAL HIGH (ref 6–23)
CHLORIDE: 102 meq/L (ref 96–112)
CO2: 31 mEq/L (ref 19–32)
Calcium: 8.1 mg/dL — ABNORMAL LOW (ref 8.4–10.5)
Creatinine, Ser: 1.6 mg/dL — ABNORMAL HIGH (ref 0.50–1.35)
GFR calc Af Amer: 42 mL/min — ABNORMAL LOW (ref >90)
GFR calc non Af Amer: 36 mL/min — ABNORMAL LOW (ref >90)
Glucose, Bld: 101 mg/dL — ABNORMAL HIGH (ref 70–99)
Potassium: 4.8 mEq/L (ref 3.7–5.3)
SODIUM: 140 meq/L (ref 137–147)

## 2014-02-10 LAB — PHOSPHORUS: PHOSPHORUS: 4 mg/dL (ref 2.3–4.6)

## 2014-02-10 LAB — MAGNESIUM: MAGNESIUM: 2.5 mg/dL (ref 1.5–2.5)

## 2014-02-11 LAB — BASIC METABOLIC PANEL
BUN: 48 mg/dL — ABNORMAL HIGH (ref 6–23)
CALCIUM: 8.1 mg/dL — AB (ref 8.4–10.5)
CO2: 32 mEq/L (ref 19–32)
Chloride: 102 mEq/L (ref 96–112)
Creatinine, Ser: 1.45 mg/dL — ABNORMAL HIGH (ref 0.50–1.35)
GFR, EST AFRICAN AMERICAN: 47 mL/min — AB (ref >90)
GFR, EST NON AFRICAN AMERICAN: 41 mL/min — AB (ref >90)
Glucose, Bld: 86 mg/dL (ref 70–99)
POTASSIUM: 4.3 meq/L (ref 3.7–5.3)
SODIUM: 141 meq/L (ref 137–147)

## 2014-02-11 LAB — PHOSPHORUS: PHOSPHORUS: 3.7 mg/dL (ref 2.3–4.6)

## 2014-02-11 LAB — MAGNESIUM: Magnesium: 2.3 mg/dL (ref 1.5–2.5)

## 2014-02-13 LAB — BASIC METABOLIC PANEL
BUN: 52 mg/dL — ABNORMAL HIGH (ref 6–23)
CALCIUM: 8.2 mg/dL — AB (ref 8.4–10.5)
CO2: 28 mEq/L (ref 19–32)
CREATININE: 1.49 mg/dL — AB (ref 0.50–1.35)
Chloride: 102 mEq/L (ref 96–112)
GFR calc Af Amer: 45 mL/min — ABNORMAL LOW (ref >90)
GFR, EST NON AFRICAN AMERICAN: 39 mL/min — AB (ref >90)
Glucose, Bld: 93 mg/dL (ref 70–99)
Potassium: 4.6 mEq/L (ref 3.7–5.3)
Sodium: 140 mEq/L (ref 137–147)

## 2014-02-13 LAB — MAGNESIUM: Magnesium: 2.5 mg/dL (ref 1.5–2.5)

## 2014-02-13 LAB — PHOSPHORUS: PHOSPHORUS: 4 mg/dL (ref 2.3–4.6)

## 2014-02-17 ENCOUNTER — Ambulatory Visit: Payer: Self-pay | Admitting: Internal Medicine

## 2014-03-26 ENCOUNTER — Ambulatory Visit (HOSPITAL_BASED_OUTPATIENT_CLINIC_OR_DEPARTMENT_OTHER): Payer: Medicare Other

## 2014-04-07 ENCOUNTER — Ambulatory Visit: Payer: Self-pay | Admitting: Family Medicine

## 2014-04-29 ENCOUNTER — Ambulatory Visit: Payer: Self-pay | Admitting: Internal Medicine

## 2014-04-29 LAB — CBC CANCER CENTER
Basophil #: 0 x10 3/mm (ref 0.0–0.1)
Basophil %: 0.5 %
Eosinophil #: 0.1 x10 3/mm (ref 0.0–0.7)
Eosinophil %: 2.2 %
HCT: 34.7 % — AB (ref 40.0–52.0)
HGB: 11.3 g/dL — ABNORMAL LOW (ref 13.0–18.0)
Lymphocyte #: 0.9 x10 3/mm — ABNORMAL LOW (ref 1.0–3.6)
Lymphocyte %: 12.8 %
MCH: 35.2 pg — AB (ref 26.0–34.0)
MCHC: 32.6 g/dL (ref 32.0–36.0)
MCV: 108 fL — AB (ref 80–100)
MONO ABS: 0.7 x10 3/mm (ref 0.2–1.0)
Monocyte %: 10.5 %
NEUTROS ABS: 5 x10 3/mm (ref 1.4–6.5)
NEUTROS PCT: 74 %
Platelet: 170 x10 3/mm (ref 150–440)
RBC: 3.22 10*6/uL — ABNORMAL LOW (ref 4.40–5.90)
RDW: 14 % (ref 11.5–14.5)
WBC: 6.7 x10 3/mm (ref 3.8–10.6)

## 2014-04-29 LAB — CREATININE, SERUM
CREATININE: 1.7 mg/dL — AB (ref 0.60–1.30)
EGFR (African American): 40 — ABNORMAL LOW
EGFR (Non-African Amer.): 34 — ABNORMAL LOW

## 2014-04-29 LAB — CALCIUM: Calcium, Total: 7.7 mg/dL — ABNORMAL LOW (ref 8.5–10.1)

## 2014-05-01 LAB — KAPPA/LAMBDA FREE LIGHT CHAINS (ARMC)

## 2014-05-01 LAB — PROT IMMUNOELECTROPHORES(ARMC)

## 2014-05-15 ENCOUNTER — Encounter (HOSPITAL_BASED_OUTPATIENT_CLINIC_OR_DEPARTMENT_OTHER): Payer: Medicare Other

## 2014-05-16 ENCOUNTER — Emergency Department (HOSPITAL_COMMUNITY): Payer: Medicare Other

## 2014-05-16 ENCOUNTER — Inpatient Hospital Stay (HOSPITAL_COMMUNITY)
Admission: EM | Admit: 2014-05-16 | Discharge: 2014-05-20 | DRG: 291 | Disposition: A | Discharge status: Skilled Nursing Facility | Payer: Medicare Other | Attending: Internal Medicine | Admitting: Internal Medicine

## 2014-05-16 ENCOUNTER — Encounter (HOSPITAL_COMMUNITY): Payer: Self-pay | Admitting: Emergency Medicine

## 2014-05-16 DIAGNOSIS — Z8673 Personal history of transient ischemic attack (TIA), and cerebral infarction without residual deficits: Secondary | ICD-10-CM

## 2014-05-16 DIAGNOSIS — I5023 Acute on chronic systolic (congestive) heart failure: Secondary | ICD-10-CM | POA: Diagnosis present

## 2014-05-16 DIAGNOSIS — J189 Pneumonia, unspecified organism: Secondary | ICD-10-CM | POA: Diagnosis present

## 2014-05-16 DIAGNOSIS — N183 Chronic kidney disease, stage 3 unspecified: Secondary | ICD-10-CM | POA: Diagnosis present

## 2014-05-16 DIAGNOSIS — Z66 Do not resuscitate: Secondary | ICD-10-CM | POA: Diagnosis not present

## 2014-05-16 DIAGNOSIS — Z7982 Long term (current) use of aspirin: Secondary | ICD-10-CM | POA: Diagnosis not present

## 2014-05-16 DIAGNOSIS — H9190 Unspecified hearing loss, unspecified ear: Secondary | ICD-10-CM | POA: Diagnosis present

## 2014-05-16 DIAGNOSIS — I129 Hypertensive chronic kidney disease with stage 1 through stage 4 chronic kidney disease, or unspecified chronic kidney disease: Secondary | ICD-10-CM | POA: Diagnosis present

## 2014-05-16 DIAGNOSIS — Z950 Presence of cardiac pacemaker: Secondary | ICD-10-CM | POA: Diagnosis present

## 2014-05-16 DIAGNOSIS — J9620 Acute and chronic respiratory failure, unspecified whether with hypoxia or hypercapnia: Secondary | ICD-10-CM | POA: Diagnosis present

## 2014-05-16 DIAGNOSIS — I10 Essential (primary) hypertension: Secondary | ICD-10-CM | POA: Diagnosis present

## 2014-05-16 DIAGNOSIS — I2789 Other specified pulmonary heart diseases: Secondary | ICD-10-CM | POA: Diagnosis present

## 2014-05-16 DIAGNOSIS — G4733 Obstructive sleep apnea (adult) (pediatric): Secondary | ICD-10-CM | POA: Diagnosis present

## 2014-05-16 DIAGNOSIS — G40909 Epilepsy, unspecified, not intractable, without status epilepticus: Secondary | ICD-10-CM | POA: Diagnosis present

## 2014-05-16 DIAGNOSIS — E46 Unspecified protein-calorie malnutrition: Secondary | ICD-10-CM | POA: Diagnosis present

## 2014-05-16 DIAGNOSIS — Z880 Allergy status to penicillin: Secondary | ICD-10-CM | POA: Diagnosis not present

## 2014-05-16 DIAGNOSIS — Z79899 Other long term (current) drug therapy: Secondary | ICD-10-CM

## 2014-05-16 DIAGNOSIS — J9601 Acute respiratory failure with hypoxia: Secondary | ICD-10-CM | POA: Diagnosis present

## 2014-05-16 DIAGNOSIS — I509 Heart failure, unspecified: Secondary | ICD-10-CM | POA: Diagnosis present

## 2014-05-16 HISTORY — DX: Presence of cardiac pacemaker: Z95.0

## 2014-05-16 HISTORY — DX: Dependence on supplemental oxygen: Z99.81

## 2014-05-16 HISTORY — DX: Heart failure, unspecified: I50.9

## 2014-05-16 HISTORY — DX: Pneumonia, unspecified organism: J18.9

## 2014-05-16 HISTORY — DX: Obstructive sleep apnea (adult) (pediatric): G47.33

## 2014-05-16 HISTORY — DX: Dependence on other enabling machines and devices: Z99.89

## 2014-05-16 HISTORY — DX: Essential (primary) hypertension: I10

## 2014-05-16 HISTORY — DX: Pure hypercholesterolemia, unspecified: E78.00

## 2014-05-16 LAB — COMPREHENSIVE METABOLIC PANEL
ALT: 15 U/L (ref 0–53)
AST: 15 U/L (ref 0–37)
Albumin: 2.7 g/dL — ABNORMAL LOW (ref 3.5–5.2)
Alkaline Phosphatase: 242 U/L — ABNORMAL HIGH (ref 39–117)
Anion gap: 8 (ref 5–15)
BILIRUBIN TOTAL: 0.5 mg/dL (ref 0.3–1.2)
BUN: 31 mg/dL — AB (ref 6–23)
CHLORIDE: 96 meq/L (ref 96–112)
CO2: 36 mEq/L — ABNORMAL HIGH (ref 19–32)
CREATININE: 1.4 mg/dL — AB (ref 0.50–1.35)
Calcium: 7.9 mg/dL — ABNORMAL LOW (ref 8.4–10.5)
GFR calc Af Amer: 49 mL/min — ABNORMAL LOW (ref >90)
GFR calc non Af Amer: 42 mL/min — ABNORMAL LOW (ref >90)
Glucose, Bld: 169 mg/dL — ABNORMAL HIGH (ref 70–99)
Potassium: 4.2 mEq/L (ref 3.7–5.3)
Sodium: 140 mEq/L (ref 137–147)
TOTAL PROTEIN: 6.7 g/dL (ref 6.0–8.3)

## 2014-05-16 LAB — URINALYSIS, ROUTINE W REFLEX MICROSCOPIC
Bilirubin Urine: NEGATIVE
Glucose, UA: NEGATIVE mg/dL
Hgb urine dipstick: NEGATIVE
Ketones, ur: NEGATIVE mg/dL
LEUKOCYTES UA: NEGATIVE
NITRITE: NEGATIVE
Protein, ur: NEGATIVE mg/dL
SPECIFIC GRAVITY, URINE: 1.013 (ref 1.005–1.030)
UROBILINOGEN UA: 0.2 mg/dL (ref 0.0–1.0)
pH: 5.5 (ref 5.0–8.0)

## 2014-05-16 LAB — CBC WITH DIFFERENTIAL/PLATELET
BASOS ABS: 0 10*3/uL (ref 0.0–0.1)
BASOS PCT: 0 % (ref 0–1)
Eosinophils Absolute: 0.1 10*3/uL (ref 0.0–0.7)
Eosinophils Relative: 1 % (ref 0–5)
HEMATOCRIT: 33.8 % — AB (ref 39.0–52.0)
Hemoglobin: 10.7 g/dL — ABNORMAL LOW (ref 13.0–17.0)
Lymphocytes Relative: 10 % — ABNORMAL LOW (ref 12–46)
Lymphs Abs: 0.7 10*3/uL (ref 0.7–4.0)
MCH: 34.3 pg — AB (ref 26.0–34.0)
MCHC: 31.7 g/dL (ref 30.0–36.0)
MCV: 108.3 fL — ABNORMAL HIGH (ref 78.0–100.0)
MONOS PCT: 9 % (ref 3–12)
Monocytes Absolute: 0.7 10*3/uL (ref 0.1–1.0)
NEUTROS ABS: 6 10*3/uL (ref 1.7–7.7)
Neutrophils Relative %: 80 % — ABNORMAL HIGH (ref 43–77)
Platelets: 290 10*3/uL (ref 150–400)
RBC: 3.12 MIL/uL — ABNORMAL LOW (ref 4.22–5.81)
RDW: 14 % (ref 11.5–15.5)
WBC: 7.5 10*3/uL (ref 4.0–10.5)

## 2014-05-16 LAB — PRO B NATRIURETIC PEPTIDE: PRO B NATRI PEPTIDE: 9531 pg/mL — AB (ref 0–450)

## 2014-05-16 LAB — STREP PNEUMONIAE URINARY ANTIGEN: Strep Pneumo Urinary Antigen: NEGATIVE

## 2014-05-16 MED ORDER — FUROSEMIDE 10 MG/ML IJ SOLN
40.0000 mg | Freq: Every day | INTRAMUSCULAR | Status: DC
  Administered 2014-05-16 – 2014-05-18 (×3): 40 mg via INTRAVENOUS
  Filled 2014-05-16 (×4): qty 4

## 2014-05-16 MED ORDER — OFLOXACIN 0.3 % OP SOLN
1.0000 [drp] | Freq: Four times a day (QID) | OPHTHALMIC | Status: DC
  Administered 2014-05-16 – 2014-05-20 (×14): 1 [drp] via OPHTHALMIC
  Filled 2014-05-16: qty 5

## 2014-05-16 MED ORDER — SODIUM CHLORIDE 0.9 % IV SOLN: 250.0000 mL | INTRAVENOUS | Status: DC | PRN

## 2014-05-16 MED ORDER — ENOXAPARIN SODIUM 30 MG/0.3ML ~~LOC~~ SOLN
30.0000 mg | SUBCUTANEOUS | Status: DC
  Administered 2014-05-16 – 2014-05-17 (×2): 30 mg via SUBCUTANEOUS
  Filled 2014-05-16 (×4): qty 0.3

## 2014-05-16 MED ORDER — ACETAMINOPHEN 325 MG PO TABS: 650.0000 mg | ORAL_TABLET | ORAL | Status: DC | PRN

## 2014-05-16 MED ORDER — DOXYCYCLINE HYCLATE 100 MG PO TABS
100.0000 mg | ORAL_TABLET | Freq: Once | ORAL | Status: AC
  Administered 2014-05-16: 100 mg via ORAL
  Filled 2014-05-16: qty 1

## 2014-05-16 MED ORDER — SODIUM CHLORIDE 0.9 % IV SOLN
INTRAVENOUS | Status: AC
  Administered 2014-05-16: 50 mL/h via INTRAVENOUS

## 2014-05-16 MED ORDER — ONDANSETRON HCL 4 MG/2ML IJ SOLN: 4.0000 mg | Freq: Four times a day (QID) | INTRAMUSCULAR | Status: DC | PRN

## 2014-05-16 MED ORDER — DEXTROSE 5 % IV SOLN
1.0000 g | INTRAVENOUS | Status: DC
  Administered 2014-05-16 – 2014-05-19 (×4): 1 g via INTRAVENOUS
  Filled 2014-05-16 (×5): qty 1

## 2014-05-16 MED ORDER — CEFEPIME HCL 1 G IJ SOLR: 1.0000 g | Freq: Three times a day (TID) | INTRAMUSCULAR | Status: DC

## 2014-05-16 MED ORDER — CEFTRIAXONE SODIUM 1 G IJ SOLR
1.0000 g | Freq: Once | INTRAMUSCULAR | Status: AC
  Administered 2014-05-16: 1 g via INTRAVENOUS
  Filled 2014-05-16: qty 10

## 2014-05-16 MED ORDER — IPRATROPIUM BROMIDE 0.02 % IN SOLN
0.5000 mg | Freq: Four times a day (QID) | RESPIRATORY_TRACT | Status: DC
  Administered 2014-05-16: 0.5 mg via RESPIRATORY_TRACT
  Filled 2014-05-16: qty 2.5

## 2014-05-16 MED ORDER — CARVEDILOL 3.125 MG PO TABS
3.1250 mg | ORAL_TABLET | Freq: Two times a day (BID) | ORAL | Status: DC
  Administered 2014-05-17 – 2014-05-20 (×7): 3.125 mg via ORAL
  Filled 2014-05-16 (×9): qty 1

## 2014-05-16 MED ORDER — SODIUM CHLORIDE 0.9 % IJ SOLN
3.0000 mL | Freq: Two times a day (BID) | INTRAMUSCULAR | Status: DC
  Administered 2014-05-19: 3 mL via INTRAVENOUS

## 2014-05-16 MED ORDER — LEVALBUTEROL HCL 0.63 MG/3ML IN NEBU
0.6300 mg | INHALATION_SOLUTION | Freq: Four times a day (QID) | RESPIRATORY_TRACT | Status: DC
  Administered 2014-05-16: 0.63 mg via RESPIRATORY_TRACT
  Filled 2014-05-16: qty 3

## 2014-05-16 MED ORDER — VANCOMYCIN HCL 10 G IV SOLR
1250.0000 mg | INTRAVENOUS | Status: DC
  Administered 2014-05-16 – 2014-05-18 (×3): 1250 mg via INTRAVENOUS
  Filled 2014-05-16 (×4): qty 1250

## 2014-05-16 MED ORDER — ENOXAPARIN SODIUM 30 MG/0.3ML ~~LOC~~ SOLN: 30.0000 mg | SUBCUTANEOUS | Status: DC

## 2014-05-16 MED ORDER — SODIUM CHLORIDE 0.9 % IV SOLN
INTRAVENOUS | Status: AC
  Administered 2014-05-16: 21:00:00 via INTRAVENOUS

## 2014-05-16 MED ORDER — SODIUM CHLORIDE 0.9 % IJ SOLN: 3.0000 mL | INTRAMUSCULAR | Status: DC | PRN

## 2014-05-16 NOTE — ED Notes (Signed)
Spoke with EDP stated does not blood cultures and to administer antibiotic.

## 2014-05-16 NOTE — Progress Notes (Signed)
Patient trasfered from ED to [phone] via bed; alert and oriented x 3; no complaints of pain; IV  in LFA  running NS@50cc /hr; skin intact, dry redness on sacral area. Orient patient to room and unit;  gave patient care guide; instructed how to use the call bell and  fall risk precautions. Will continue to monitor the patient.

## 2014-05-16 NOTE — ED Notes (Signed)
History of pneumonia 2 days ago and EMS called for shortness of breath. Patient hard of hearing alert answering and following commands appropriate. Denies chest pain states is constipated.

## 2014-05-16 NOTE — ED Notes (Signed)
Admit Doctor at bedside.  

## 2014-05-16 NOTE — ED Provider Notes (Signed)
CSN: [id number]     Arrival date & time 05/16/14  1211 History   First MD Initiated Contact with Patient 05/16/14 1212     Chief Complaint  Patient presents with  . Shortness of Breath     (Consider location/radiation/quality/duration/timing/severity/associated sxs/prior Treatment) HPI Patient presents with dyspnea. Patient denies pain. Pneumonia.  Patient has been living at home with family members.  According to the patient he felt particularly dyspneic today, without new pain, lightheadedness, syncope, fever, chills, vomiting. No clear precipitant for today's exacerbation. No clear alleviating factors. Per EMS the patient was hypoxic on her arrival with a pulse oximetry in the ED percent range. Patient is currently receiving oxygen via nonrebreather mask, which is good tolerance, and appropriate oxygenation No past medical history on file. No past surgical history on file. No family history on file. History  Substance Use Topics  . Smoking status: Not on file  . Smokeless tobacco: Not on file  . Alcohol Use: Not on file    Review of Systems  Constitutional:       Per HPI, otherwise negative  HENT:       Per HPI, otherwise negative  Respiratory:       Per HPI, otherwise negative  Cardiovascular:       Per HPI, otherwise negative  Gastrointestinal: Negative for vomiting.  Endocrine:       Negative aside from HPI  Genitourinary:       Neg aside from HPI   Musculoskeletal:       Per HPI, otherwise negative  Skin: Negative.   Neurological: Negative for syncope.      Allergies  Review of patient's allergies indicates not on file.  Home Medications   Prior to Admission medications   Not on File   SpO2 96% Physical Exam  Nursing note and vitals reviewed. Constitutional: He is oriented to person, place, and time. He appears well-developed. He appears distressed.  HENT:  Head: Normocephalic and atraumatic.  Eyes: Conjunctivae and EOM are normal.   Cardiovascular: Normal rate and regular rhythm.   Pulmonary/Chest: No stridor. He is in respiratory distress.  Abdominal: He exhibits no distension.  Musculoskeletal: He exhibits edema.  Neurological: He is alert and oriented to person, place, and time.  Skin: Skin is warm. He is diaphoretic.  Psychiatric: He has a normal mood and affect.    ED Course  Procedures (including critical care time) Labs Review Labs Reviewed  CBC WITH DIFFERENTIAL - Abnormal; Notable for the following:    RBC 3.12 (*)    Hemoglobin 10.7 (*)    HCT 33.8 (*)    MCV 108.3 (*)    MCH 34.3 (*)    Neutrophils Relative % 80 (*)    Lymphocytes Relative 10 (*)    All other components within normal limits  COMPREHENSIVE METABOLIC PANEL - Abnormal; Notable for the following:    CO2 36 (*)    Glucose, Bld 169 (*)    BUN 31 (*)    Creatinine, Ser 1.40 (*)    Calcium 7.9 (*)    Albumin 2.7 (*)    Alkaline Phosphatase 242 (*)    GFR calc non Af Amer 42 (*)    GFR calc Af Amer 49 (*)    All other components within normal limits  PRO B NATRIURETIC PEPTIDE - Abnormal; Notable for the following:    Pro B Natriuretic peptide (BNP) 9531.0 (*)    All other components within normal limits  URINALYSIS, ROUTINE W REFLEX  MICROSCOPIC - Abnormal; Notable for the following:    APPearance CLOUDY (*)    All other components within normal limits    Imaging Review Dg Chest Port 1 View  05/16/2014   CLINICAL DATA:  Dyspnea  EXAM: PORTABLE CHEST - 1 VIEW  COMPARISON:  01/30/2014  FINDINGS: Cardiac shadow remains enlarged. A 2 lead pacing device is again noted bilateral pleural effusions are seen but improved from the prior study. Persistent atelectasis is noted in the right lung base. No acute bony abnormality is noted.  IMPRESSION: Persistent right basilar atelectasis. Small pleural effusions are noted bilaterally. The right-sided effusion has improved somewhat from the prior exam.   Electronically Signed   By: Alcide Clever  M.D.   On: 05/16/2014 13:34   I interpreted the x-ray, given the patient's tachypnea, hypoxia, increased work of breathing, there is concern for pneumonia.   EKG Interpretation   Date/Time:  Friday May 16 2014 12:22:04 EDT Ventricular Rate:  77 PR Interval:    QRS Duration: 200 QT Interval:  473 QTC Calculation: 535 R Axis:   -65 Text Interpretation:  Accelerated junctional rhythm Left bundle branch  block regular rhythm, wide complex, likely LBBB Artifact no discernable  pacer spikes consistently Abnormal ekg Confirmed by Gerhard Munch  MD  [id number]) on 05/16/2014 12:27:53 PM     I discussed all findings the patient's family. On repeat exam the patient is improved, denies ongoing concerns.  MDM   Final diagnoses:  CAP (community acquired pneumonia)    Patient presents several days after outpatient diagnosis of pneumonia, now with worsening dyspnea.  Patient has improvement in his condition he was at home oxygen.  Patient's labs are notable for chronic kidney disease, elevated BNP, the patient has a pacemaker in place, and currently denies any chest pain, and labs do not suggest ongoing coronary ischemia. Patient was admitted for further evaluation and management    Gerhard Munch, MD 05/16/14 1549

## 2014-05-16 NOTE — Progress Notes (Signed)
The patient's son was at bedside, states patient wears CPAP at night.  This was confirmed by primary caregiver.  Patient was placed on CPAP using ffm as per home regimen.  Autotitration mode used, max 20, min 4.  6 LPM bleed in oxygen.  RN aware.

## 2014-05-16 NOTE — Progress Notes (Signed)
ANTIBIOTIC CONSULT NOTE - INITIAL  Pharmacy Consult for vanc Indication: pneumonia  Allergies  Allergen Reactions  . Penicillins Nausea And Vomiting    Patient Measurements: Height:  (177.8 cm) Weight: 200 lb (90.719 kg) IBW/kg (Calculated) : 73 Adjusted Body Weight:   Vital Signs: Temp: 99 F (37.2 C) (08/28 1233) Temp src: Rectal (08/28 1233) BP: 142/84 mmHg (08/28 1730) Pulse Rate: 62 (08/28 1730) Intake/Output from previous day:   Intake/Output from this shift:    Labs:  Recent Labs  05/16/14 1222  WBC 7.5  HGB 10.7*  PLT 290  CREATININE 1.40*   Estimated Creatinine Clearance: 38.9 ml/min (by C-G formula based on Cr of 1.4). No results found for this basename: VANCOTROUGH, VANCOPEAK, VANCORANDOM, GENTTROUGH, GENTPEAK, GENTRANDOM, TOBRATROUGH, TOBRAPEAK, TOBRARND, AMIKACINPEAK, AMIKACINTROU, AMIKACIN,  in the last 72 hours   Microbiology: No results found for this or any previous visit (from the past 720 hour(s)).  Medical History: Past Medical History  Diagnosis Date  . CHF (congestive heart failure)   . Hypertension   . Pacemaker     Medications:  Scheduled:  . sodium chloride   Intravenous STAT  . [START ON 05/17/2014] carvedilol  3.125 mg Oral BID WC  . ceFEPime (MAXIPIME) IV  1 g Intravenous Q24H  . enoxaparin (LOVENOX) injection  30 mg Subcutaneous Q24H  . furosemide  40 mg Intravenous Daily  . levalbuterol  0.63 mg Nebulization Q6H   And  . ipratropium  0.5 mg Nebulization Q6H  . sodium chloride  3 mL Intravenous Q12H   Infusions:   Assessment: 80 yo who was admitted for SOB x 1 wk. Had PNA back in May. He was started on zithromax outpt last week. Vanc and cefepime have been ordered empirically here.   Goal of Therapy:  Vancomycin trough level 15-20 mcg/ml  Plan:   Vanc 1.25g IV q24 F/u with trough

## 2014-05-16 NOTE — H&P (Signed)
History and Physical       Hospital Admission Note Date: 05/16/2014  Patient name: Dominic Lopez Medical record number: [id number] Date of birth: 1921-06-24 Age: 80 y.o. Gender: male  PCP: Vonita Moss, MD    Chief Complaint:  Shortness of breath for the last 1 week, worse today  HPI: Patient is a 80 year old male with a prior history of pneumonia in May, 2015, hypertension, pacemaker, CHF EF unknown, presented to ED with shortness of breath. History was mostly obtained from the patient's daughter-in-law, patient is very hard of hearing and was not able to provide good history himself. Patient's daughter are at the bedside reported that he is been having difficulty breathing, feeling weak for the last 1 week. Patient had gone to his PCP last week on Thursday and was started on a Zithromax. She reported that there was no significant improvement after Zithromax.  Patient was particularly more short of breath today, he usually wears oxygen 2 L at night however has been using oxygen 24/7.  Chest x-ray shows persistent right basilar atelectasis, small pleural effusions bilaterally. Per patient's daughter-in-law, he had prior aspiration issues and doesn't follow aspiration precautions. Also patient has been eating lot of salt over the weekends.  Review of Systems:  Constitutional: Denies fever, chills, diaphoresis, + poor appetite and fatigue.  HEENT: Denies photophobia, eye pain, redness, hearing loss, ear pain, congestion, sore throat, rhinorrhea, sneezing, mouth sores, trouble swallowing, neck pain, neck stiffness and tinnitus.   Respiratory:  please see history of present illness  Cardiovascular: Denies chest pain, palpitations and leg swelling.  Gastrointestinal: Denies nausea, vomiting, abdominal pain, diarrhea, constipation, blood in stool and abdominal distention.  Genitourinary: Denies dysuria, urgency, frequency, hematuria, flank  pain and difficulty urinating.  Musculoskeletal: Denies myalgias, back pain, joint swelling, arthralgias and gait problem.  Skin: Denies pallor, rash and wound.  Neurological: Denies dizziness, seizures, syncope, weakness, light-headedness, numbness and headaches.  Hematological: Denies adenopathy. Easy bruising, personal or family bleeding history  Psychiatric/Behavioral: Denies suicidal ideation, mood changes, confusion, nervousness, sleep disturbance and agitation  Past Medical History: Past Medical History  Diagnosis Date  . CHF (congestive heart failure)   . Hypertension   . Pacemaker    History reviewed. No pertinent past surgical history.  Medications: Prior to Admission medications   Not on File    Allergies:  No Known Allergies  Social History:  reports that he has never smoked. He does not have any smokeless tobacco history on file. He reports that he does not drink alcohol or use illicit drugs.  Family History: No family history on file.  Physical Exam: Blood pressure 131/72, pulse 63, temperature 99 F (37.2 C), temperature source Rectal, resp. rate 24, height  (1.778 m), weight 90.719 kg (200 lb), SpO2 100.00%. General: Alert, awake, oriented x3, in no acute distress, Very hard of hearing. HEENT: normocephalic, atraumatic, anicteric sclera, pink conjunctiva, pupils equal and reactive to light and accomodation, oropharynx clear Neck: supple, no masses or lymphadenopathy, no goiter, no bruits  Heart: Regular rate and rhythm, without murmurs, rubs or gallops. Lungs:  decreased breath sound at the bases  Abdomen: Soft, nontender, nondistended, positive bowel sounds, no masses. Extremities: No clubbing, cyanosis, 1+ edema with positive pedal pulses. Neuro: Grossly intact, no focal neurological deficits, strength 5/5 upper and lower extremities bilaterally Psych: alert and oriented x 3, normal mood and affect Skin: no rashes or lesions, warm and dry   LABS on  Admission:  Basic Metabolic Panel:  Recent Labs  Lab 05/16/14 1222  NA 140  K 4.2  CL 96  CO2 36*  GLUCOSE 169*  BUN 31*  CREATININE 1.40*  CALCIUM 7.9*   Liver Function Tests:  Recent Labs Lab 05/16/14 1222  AST 15  ALT 15  ALKPHOS 242*  BILITOT 0.5  PROT 6.7  ALBUMIN 2.7*   No results found for this basename: LIPASE, AMYLASE,  in the last 168 hours No results found for this basename: AMMONIA,  in the last 168 hours CBC:  Recent Labs Lab 05/16/14 1222  WBC 7.5  NEUTROABS 6.0  HGB 10.7*  HCT 33.8*  MCV 108.3*  PLT 290   Cardiac Enzymes: No results found for this basename: CKTOTAL, CKMB, CKMBINDEX, TROPONINI,  in the last 168 hours BNP: No components found with this basename: POCBNP,  CBG: No results found for this basename: GLUCAP,  in the last 168 hours   Radiological Exams on Admission: Dg Chest Port 1 View  05/16/2014   CLINICAL DATA:  Dyspnea  EXAM: PORTABLE CHEST - 1 VIEW  COMPARISON:  01/30/2014  FINDINGS: Cardiac shadow remains enlarged. A 2 lead pacing device is again noted bilateral pleural effusions are seen but improved from the prior study. Persistent atelectasis is noted in the right lung base. No acute bony abnormality is noted.  IMPRESSION: Persistent right basilar atelectasis. Small pleural effusions are noted bilaterally. The right-sided effusion has improved somewhat from the prior exam.   Electronically Signed   By: Alcide Clever M.D.   On: 05/16/2014 13:34    Assessment/Plan Principal Problem:   HCAP (healthcare-associated pneumonia) with acute on chronic respiratory failure with hypoxia - Will admit to telemetry, and placed on pneumonia protocol, obtain urine legionella antigen, urine strep condition, sputum cultures - Placed on IV vancomycin and cefepime, scheduled nebulizer treatments, O2 - Will need home O2 evaluation prior to DC - Will place on dysphagia diet and obtain swallow evaluation  Active Problems:   Hypertension Currently  stable     Systolic CHF, acute on chronic: Likely worsened due to #1 and dietary indiscretion - EF unknown, no echo on record  - Will place on CHF protocol, placed on IV diuresis, beta blocker  - Not only ACEI due to renal insufficiency, check 2-D echo, strict I's/O's, weights daily  - Will also check venous Doppler to rule out any DVT   Chronic kidney disease: Stage III - Baseline creatinine appears to be 1.4 - Monitor creatinine closely with diuresis  Protein calorie malnutrition: Albumin 2.7 - Place nutrition consult  DVT prophylaxis:  Lovenox   CODE STATUS:  Full code   Family Communication: Admission, patients condition and plan of care including tests being ordered have been discussed with the patient's daughter-in-law  who indicates understanding and agree with the plan and Code Status   Further plan will depend as patient's clinical course evolves and further radiologic and laboratory data become available.   Time Spent on Admission: 1 hour   Inigo Lantigua M.D. [facility] 05/16/2014, 3:34 PM Pager: [phone]  If 7PM-7AM, please contact night-coverage [email or web address] Password [email or web address]  **Disclaimer: This note was dictated with voice recognition software. Similar sounding words can inadvertently be transcribed and this note may contain transcription errors which may not have been corrected upon publication of note.**

## 2014-05-17 DIAGNOSIS — J189 Pneumonia, unspecified organism: Secondary | ICD-10-CM

## 2014-05-17 DIAGNOSIS — I509 Heart failure, unspecified: Secondary | ICD-10-CM

## 2014-05-17 DIAGNOSIS — J9600 Acute respiratory failure, unspecified whether with hypoxia or hypercapnia: Secondary | ICD-10-CM

## 2014-05-17 DIAGNOSIS — M7989 Other specified soft tissue disorders: Secondary | ICD-10-CM

## 2014-05-17 DIAGNOSIS — R0602 Shortness of breath: Secondary | ICD-10-CM

## 2014-05-17 DIAGNOSIS — I10 Essential (primary) hypertension: Secondary | ICD-10-CM

## 2014-05-17 DIAGNOSIS — I5023 Acute on chronic systolic (congestive) heart failure: Principal | ICD-10-CM

## 2014-05-17 LAB — LEGIONELLA ANTIGEN, URINE: Legionella Antigen, Urine: NEGATIVE

## 2014-05-17 LAB — CBC
HCT: 33.2 % — ABNORMAL LOW (ref 39.0–52.0)
Hemoglobin: 10.6 g/dL — ABNORMAL LOW (ref 13.0–17.0)
MCH: 34.3 pg — ABNORMAL HIGH (ref 26.0–34.0)
MCHC: 31.9 g/dL (ref 30.0–36.0)
MCV: 107.4 fL — AB (ref 78.0–100.0)
Platelets: 267 10*3/uL (ref 150–400)
RBC: 3.09 MIL/uL — AB (ref 4.22–5.81)
RDW: 14.1 % (ref 11.5–15.5)
WBC: 7.7 10*3/uL (ref 4.0–10.5)

## 2014-05-17 LAB — BASIC METABOLIC PANEL WITH GFR
Anion gap: 11 (ref 5–15)
BUN: 30 mg/dL — ABNORMAL HIGH (ref 6–23)
CO2: 35 meq/L — ABNORMAL HIGH (ref 19–32)
Calcium: 7.9 mg/dL — ABNORMAL LOW (ref 8.4–10.5)
Chloride: 97 meq/L (ref 96–112)
Creatinine, Ser: 1.33 mg/dL (ref 0.50–1.35)
GFR calc Af Amer: 52 mL/min — ABNORMAL LOW (ref >90)
GFR calc non Af Amer: 45 mL/min — ABNORMAL LOW (ref >90)
Glucose, Bld: 112 mg/dL — ABNORMAL HIGH (ref 70–99)
Potassium: 3.8 meq/L (ref 3.7–5.3)
Sodium: 143 meq/L (ref 137–147)

## 2014-05-17 LAB — URINALYSIS, ROUTINE W REFLEX MICROSCOPIC
BILIRUBIN URINE: NEGATIVE
GLUCOSE, UA: NEGATIVE mg/dL
HGB URINE DIPSTICK: NEGATIVE
Ketones, ur: NEGATIVE mg/dL
Leukocytes, UA: NEGATIVE
Nitrite: NEGATIVE
PH: 6 (ref 5.0–8.0)
Protein, ur: NEGATIVE mg/dL
Specific Gravity, Urine: 1.011 (ref 1.005–1.030)
Urobilinogen, UA: 0.2 mg/dL (ref 0.0–1.0)

## 2014-05-17 LAB — INFLUENZA PANEL BY PCR (TYPE A & B)
H1N1 flu by pcr: NOT DETECTED
Influenza A By PCR: NEGATIVE
Influenza B By PCR: NEGATIVE

## 2014-05-17 MED ORDER — IPRATROPIUM-ALBUTEROL 0.5-2.5 (3) MG/3ML IN SOLN
3.0000 mL | Freq: Three times a day (TID) | RESPIRATORY_TRACT | Status: DC
  Administered 2014-05-17 – 2014-05-18 (×4): 3 mL via RESPIRATORY_TRACT
  Filled 2014-05-17 (×4): qty 3

## 2014-05-17 MED ORDER — POTASSIUM CHLORIDE CRYS ER 20 MEQ PO TBCR
20.0000 meq | EXTENDED_RELEASE_TABLET | Freq: Every day | ORAL | Status: DC
  Administered 2014-05-17 – 2014-05-18 (×2): 20 meq via ORAL
  Filled 2014-05-17 (×4): qty 1

## 2014-05-17 MED ORDER — ENSURE COMPLETE PO LIQD
237.0000 mL | Freq: Every day | ORAL | Status: DC
  Administered 2014-05-18 – 2014-05-20 (×3): 237 mL via ORAL

## 2014-05-17 MED ORDER — ALBUTEROL SULFATE (2.5 MG/3ML) 0.083% IN NEBU: 2.5000 mg | INHALATION_SOLUTION | RESPIRATORY_TRACT | Status: DC | PRN

## 2014-05-17 MED ORDER — CARBAMAZEPINE 100 MG PO CHEW
100.0000 mg | CHEWABLE_TABLET | Freq: Three times a day (TID) | ORAL | Status: DC
  Administered 2014-05-17 – 2014-05-20 (×9): 100 mg via ORAL
  Filled 2014-05-17 (×11): qty 1

## 2014-05-17 NOTE — Evaluation (Addendum)
Clinical/Bedside Swallow Evaluation Patient Details  Name: Dominic Lopez MRN: [id number] Date of Birth: 17-Feb-1921  Today's Date: 05/17/2014 Time: 4098-1191 SLP Time Calculation (min): 16 min  Past Medical History:  Past Medical History  Diagnosis Date  . CHF (congestive heart failure)   . Hypertension   . Pacemaker   . High cholesterol   . Pneumonia 2014 X 1; 02/2014; 05/16/2014  . On home oxygen therapy     " 2L q hs" (05/16/2014)  . OSA on CPAP     "I think he's on 3L forced ventilation" (05/16/2014)   Past Surgical History:  Past Surgical History  Procedure Laterality Date  . Cataract extraction, bilateral Bilateral   . Brain surgery  1990's    "cut a nerve on left side of face @ [facility]; no feeling on left side since"  . Insert / replace / remove pacemaker  2000's   HPI:  80 year old male with a prior history of pneumonia in May, 2015, hypertension, pacemaker, CHF admitted with shortness of breath. CXR Persistent right basilar atelectasis. Small pleural effusions are noted bilaterally. The right-sided effusion has improved somewhat from the prior exam. Per MD note daughter-in-law, he had prior aspiration issues and doesn't follow aspiration precautions.  SLP did not find documentation of ST involvement in EPIC.  Diagnosed with healthcare-associated pna.   Assessment / Plan / Recommendation Clinical Impression  Per MD notes, family reported history of dysphagia/aspiration and did not follow recommended precautions (no family present).  Indications of  pharyngeal dysfunction evidenced by an audible/sluggish swallow sometimes indicative of structural or motor impairments and multiple swallows possibly due to pharyngeal residue.  He exhibited a frequent throat clear that appeared to be habitual versus po driven (one delayed throat clear may have been po related).  Additional risks include decreased mentation and hearing impairment.  Large sips obtained with straw increasing risk,  therefore recommend no straws, small cup sips of thin and continue a regular texture.  ST will follow for safety with recommendations and family education.      Aspiration Risk  Moderate    Diet Recommendation Regular;Thin liquid   Liquid Administration via: Cup;No straw Medication Administration: Whole meds with puree Supervision: Patient able to self feed;Full supervision/cueing for compensatory strategies Compensations: Slow rate;Small sips/bites Postural Changes and/or Swallow Maneuvers: Seated upright 90 degrees    Other  Recommendations Oral Care Recommendations: Oral care BID   Follow Up Recommendations  None    Frequency and Duration min 2x/week  2 weeks   Pertinent Vitals/Pain WDL         Swallow Study   Type of Home: House Available Help at Discharge: Available 24 hours/day (Per pt)       Oral/Motor/Sensory Function Overall Oral Motor/Sensory Function: Appears within functional limits for tasks assessed   Ice Chips Ice chips: Not tested   Thin Liquid Thin Liquid: Impaired Presentation: Cup;Straw Pharyngeal  Phase Impairments: Throat Clearing - Delayed;Multiple swallows (audible, sluggish swallow)    Nectar Thick Nectar Thick Liquid: Not tested   Honey Thick Honey Thick Liquid: Not tested   Puree Puree: Impaired Presentation: Spoon Pharyngeal Phase Impairments: Multiple swallows (audible swallow)   Solid   GO    Solid: Within functional limits       [name] 05/17/2014,10:21 AM [phone]

## 2014-05-17 NOTE — Progress Notes (Signed)
PATIENT DETAILS Name: Dominic Lopez Age: 80 y.o. Sex: male Date of Birth: [birth year] Admit Date: 05/16/2014 Admitting Physician Ripudeep Jenna Luo, MD WUJ:[id number],GNFA, MD  Subjective: No major issues overnight  Assessment/Plan: Principal Problem:   HCAP (healthcare-associated pneumonia) - Admitted, started on vancomycin and cefepime-currently day 2. - Remains afebrile, no leukocytosis. - Influenza panel negative, blood cultures pending, urine streptococcal antigen negative, urine Legionella antigen negative. - Continue with antibiotics, follow clinical course  Active Problems:  Acute on chronic systolic heart failure - Continue with IV Lasix and Coreg, seems to be much more compensated than on admission. - Suspect not a candidate for ACE inhibitors given chronic kidney disease stage III - Daily weights, strict intake and output - Await 2-D echocardiogram  Acute hypoxic respiratory failure - Multifactorial-from likely above 2 issues - Treated with antibiotics, Lasix, and other supportive care. Wean off oxygen as tolerated. - Lower extremity Doppler negative for DVT, doubt pulmonary embolism-will not commence further workup.  Chronic kidney disease stage III - Monitor creatinine, currently close to her usual baseline.  Hypertension - Moderately controlled, continue with Coreg and Lasix.  History of CVA - Continue with aspirin, and statins  History of seizure disorder - Resume Tegretol    Pacemaker   Disposition: Remain inpatient  DVT Prophylaxis: Prophylactic Lovenox   Code Status: Full code  Family Communication None at bedside  Procedures:  None  CONSULTS:  None  Time spent 40 minutes-which includes 50% of the time with face-to-face with patient/ family and coordinating care related to the above assessment and plan.    MEDICATIONS: Scheduled Meds: . carvedilol  3.125 mg Oral BID WC  . ceFEPime (MAXIPIME) IV  1 g Intravenous Q24H  .  enoxaparin (LOVENOX) injection  30 mg Subcutaneous Q24H  . furosemide  40 mg Intravenous Daily  . ipratropium-albuterol  3 mL Nebulization TID  . ofloxacin  1 drop Left Eye QID  . sodium chloride  3 mL Intravenous Q12H  . vancomycin  1,250 mg Intravenous Q24H   Continuous Infusions:  PRN Meds:.sodium chloride, acetaminophen, albuterol, ondansetron (ZOFRAN) IV, sodium chloride  Antibiotics: Anti-infectives   Start     Dose/Rate Route Frequency Ordered Stop   05/16/14 2200  ceFEPIme (MAXIPIME) 1 g in dextrose 5 % 50 mL IVPB  Status:  Discontinued     1 g 100 mL/hr over 30 Minutes Intravenous 3 times per day 05/16/14 1808 05/16/14 1813   05/16/14 2000  ceFEPIme (MAXIPIME) 1 g in dextrose 5 % 50 mL IVPB     1 g 100 mL/hr over 30 Minutes Intravenous Every 24 hours 05/16/14 1813 05/24/14 1959   05/16/14 1900  vancomycin (VANCOCIN) 1,250 mg in sodium chloride 0.9 % 250 mL IVPB     1,250 mg 166.7 mL/hr over 90 Minutes Intravenous Every 24 hours 05/16/14 1819     05/16/14 1415  cefTRIAXone (ROCEPHIN) 1 g in dextrose 5 % 50 mL IVPB     1 g 100 mL/hr over 30 Minutes Intravenous  Once 05/16/14 1403 05/16/14 1525   05/16/14 1415  doxycycline (VIBRA-TABS) tablet 100 mg     100 mg Oral  Once 05/16/14 1403 05/16/14 1447       PHYSICAL EXAM: Vital signs in last 24 hours: Filed Vitals:   05/16/14 2206 05/16/14 2209 05/17/14 0825 05/17/14 0831  BP: 93/82  163/78   Pulse: 66  61   Temp: 98.6 F (37 C)     TempSrc:  Axillary     Resp: 18     Height:      Weight:      SpO2: 100% 100%  98%    Weight change:  Filed Weights   05/16/14 1223  Weight: 90.719 kg (200 lb)   Body mass index is 28.7 kg/(m^2).   Gen Exam: Awake, somewhat alert- but very hard of hearing.  Neck: Supple, No JVD.   Chest: Bibasilar rales, but otherwise clear CVS: S1 S2 Regular Abdomen: soft, BS +, non tender, non distended.  Extremities: 1-2+ edema, lower extremities warm to touch. Neurologic: Non Focal-  difficult exam-very hard of hearing- but seems to be moving all 4 extremities Skin: No Rash.   Wounds: N/A.   Intake/Output from previous day:  Intake/Output Summary (Last 24 hours) at 05/17/14 1353 Last data filed at 05/17/14 0911  Gross per 24 hour  Intake    490 ml  Output    700 ml  Net   -210 ml     LAB RESULTS: CBC  Recent Labs Lab 05/16/14 1222 05/17/14 0650  WBC 7.5 7.7  HGB 10.7* 10.6*  HCT 33.8* 33.2*  PLT 290 267  MCV 108.3* 107.4*  MCH 34.3* 34.3*  MCHC 31.7 31.9  RDW 14.0 14.1  LYMPHSABS 0.7  --   MONOABS 0.7  --   EOSABS 0.1  --   BASOSABS 0.0  --     Chemistries   Recent Labs Lab 05/16/14 1222 05/17/14 0650  NA 140 143  K 4.2 3.8  CL 96 97  CO2 36* 35*  GLUCOSE 169* 112*  BUN 31* 30*  CREATININE 1.40* 1.33  CALCIUM 7.9* 7.9*    CBG: No results found for this basename: GLUCAP,  in the last 168 hours  GFR Estimated Creatinine Clearance: 41 ml/min (by C-G formula based on Cr of 1.33).  Coagulation profile No results found for this basename: INR, PROTIME,  in the last 168 hours  Cardiac Enzymes No results found for this basename: CK, CKMB, TROPONINI, MYOGLOBIN,  in the last 168 hours  No components found with this basename: POCBNP,  No results found for this basename: DDIMER,  in the last 72 hours No results found for this basename: HGBA1C,  in the last 72 hours No results found for this basename: CHOL, HDL, LDLCALC, TRIG, CHOLHDL, LDLDIRECT,  in the last 72 hours No results found for this basename: TSH, T4TOTAL, FREET3, T3FREE, THYROIDAB,  in the last 72 hours No results found for this basename: VITAMINB12, FOLATE, FERRITIN, TIBC, IRON, RETICCTPCT,  in the last 72 hours No results found for this basename: LIPASE, AMYLASE,  in the last 72 hours  Urine Studies No results found for this basename: UACOL, UAPR, USPG, UPH, UTP, UGL, UKET, UBIL, UHGB, UNIT, UROB, ULEU, UEPI, UWBC, URBC, UBAC, CAST, CRYS, UCOM, BILUA,  in the last 72  hours  MICROBIOLOGY: No results found for this or any previous visit (from the past 240 hour(s)).  RADIOLOGY STUDIES/RESULTS: Dg Chest Port 1 View  05/16/2014   CLINICAL DATA:  Dyspnea  EXAM: PORTABLE CHEST - 1 VIEW  COMPARISON:  01/30/2014  FINDINGS: Cardiac shadow remains enlarged. A 2 lead pacing device is again noted bilateral pleural effusions are seen but improved from the prior study. Persistent atelectasis is noted in the right lung base. No acute bony abnormality is noted.  IMPRESSION: Persistent right basilar atelectasis. Small pleural effusions are noted bilaterally. The right-sided effusion has improved somewhat from the prior exam.   Electronically  Signed   By: Alcide Clever M.D.   On: 05/16/2014 13:34    Jeoffrey Massed, MD  [facility] Pager:[phone]  If 7PM-7AM, please contact night-coverage [email or web address] Password [email or web address] 05/17/2014, 1:53 PM   LOS: 1 day   **Disclaimer: This note may have been dictated with voice recognition software. Similar sounding words can inadvertently be transcribed and this note may contain transcription errors which may not have been corrected upon publication of note.**

## 2014-05-17 NOTE — Progress Notes (Signed)
Nutrition Brief Note  Patient identified on the Malnutrition Screening Tool (MST) Report and consult.   Wt Readings from Last 15 Encounters:  05/17/14 228 lb (103.42 kg)    Body mass index is 32.71 kg/(m^2). Patient meets criteria for class I obesity based on current BMI.   Current diet order is heart healthy, patient is consuming approximately 85% of meals at this time. Labs and medications reviewed.   Patient is a 80 year old male with a prior history of pneumonia in May, 2015, hypertension, pacemaker, CHF EF unknown, presented to ED with shortness of breath.   - Pt difficult to understand, son at bedside - Pt eating well PTA, 3 meals/day, however per MD notes, does eat a lot of salt over the weekends - son said he would try to help pt work on that - Drinks chocolate Ensure for breakfast, will order during admission - Pt gave RD breakfast order - will place for tomorrow  - Eating well during admission with no nutritional concerns  No nutrition interventions warranted at this time. If nutrition issues arise, please consult RD.   Charlott Rakes MS, RD, LDN [phone] Weekend/After Hours Pager

## 2014-05-17 NOTE — Progress Notes (Signed)
VASCULAR LAB PRELIMINARY  PRELIMINARY  PRELIMINARY  PRELIMINARY  Bilateral lower extremity venous duplex  completed.    Preliminary report:  Bilateral:  No evidence of DVT, superficial thrombosis, or Baker's Cyst.    Anely Spiewak, RVT 05/17/2014, 10:58 AM

## 2014-05-17 NOTE — Evaluation (Signed)
Physical Therapy Evaluation Patient Details Name: Dominic Lopez MRN: [id number] DOB: [birth year] Today's Date: 05/17/2014   History of Present Illness  Patient is a 80 year old male with a prior history of pneumonia in May, 2015, hypertension, pacemaker, CHF EF unknown, presented to ED with shortness of breath for last week. Wears oxygen 2 L at night however has been using oxygen 24/7.   Clinical Impression  Pt is demonstrating difficulty with initiating  Mobility, to understand therapist requests and to retain information  About safety.  He is appropriate to screen for CIR, but wants to go home and will need the case manager to discuss with family to get their wishes.    Follow Up Recommendations SNF;Supervision/Assistance - 24 hour;CIR    Equipment Recommendations  None recommended by PT    Recommendations for Other Services Rehab consult     Precautions / Restrictions Precautions Precautions: Fall;ICD/Pacemaker;Other (comment) (HOH and needs Huel Cote hearing aid to be understood) Restrictions Weight Bearing Restrictions: No      Mobility  Bed Mobility Overal bed mobility: Needs Assistance Bed Mobility: Sidelying to Sit   Sidelying to sit: Mod assist       General bed mobility comments: cues for hand placement and sequencing, requires tactile cues to initiate  Transfers Overall transfer level: Needs assistance Equipment used: Rolling walker (2 wheeled) Transfers: Sit to/from [employer] Sit to Stand: Mod assist Stand pivot transfers: Mod assist       General transfer comment: cued hand placement, cues for assisting lines and to  initiate the task, needs remindersabout  the goal of the activity after first request. Flexed posture and knees, slow sidesteps with  no awareness of safety for hand placement and to control descent   Ambulation/Gait Ambulation/Gait assistance: Mod assist Ambulation Distance (Feet): 3 Feet Assistive device: Rolling walker (2  wheeled) Gait Pattern/deviations: Step-to pattern;Decreased dorsiflexion - right;Decreased dorsiflexion - left;Shuffle;Wide base of support;Trunk flexed Gait velocity: slowed Gait velocity interpretation: Below normal speed for age/gender General Gait Details: flexed posture and knees, slow pace with limited awareness of  safety and the direction, goal and use of walker  Stairs            Wheelchair Mobility    Modified Rankin (Stroke Patients Only)       Balance Overall balance assessment: Needs assistance Sitting-balance support: Single extremity supported;Feet supported Sitting balance-Leahy Scale: Poor Sitting balance - Comments: cues for keeping feet on floor Postural control: Posterior lean Standing balance support: Bilateral upper extremity supported Standing balance-Leahy Scale: Poor                               Pertinent Vitals/Pain Pain Assessment: No/denies pain BP was 163/78, pulse 61and O2 sats 98% per nsg notes.    Home Living Family/patient expects to be discharged to:: Private residence Living Arrangements: Alone;Other (Comment) (Has several caregivers per pt) Available Help at Discharge: Available 24 hours/day (Per pt) Type of Home: House Home Access: Ramped entrance     Home Layout: One level Home Equipment: Walker - 2 wheels;Cane - single point;Bedside commode;Tub bench;Wheelchair - manual      Prior Function Level of Independence: Needs assistance   Gait / Transfers Assistance Needed: walker  ADL's / Homemaking Assistance Needed: help with household        Hand Dominance   Dominant Hand: Right    Extremity/Trunk Assessment   Upper Extremity Assessment: Generalized weakness  Lower Extremity Assessment: Generalized weakness      Cervical / Trunk Assessment: Normal  Communication   Communication: Receptive difficulties  Cognition Arousal/Alertness: Awake/alert Behavior During Therapy: WFL for tasks  assessed/performed Overall Cognitive Status: Impaired/Different from baseline Area of Impairment: Orientation;Attention;Memory;Following commands;Safety/judgement;Awareness;Problem solving Orientation Level: Time Current Attention Level: Divided Memory: Decreased recall of precautions;Decreased short-term memory Following Commands: Follows one step commands inconsistently Safety/Judgement: Decreased awareness of safety Awareness: Intellectual;Anticipatory Problem Solving: Slow processing;Decreased initiation;Difficulty sequencing;Requires verbal cues;Requires tactile cues      General Comments General comments (skin integrity, edema, etc.): Pt was not able to understand all questions from PT not just due to hearing, but comprehension for  PLOF.  Contradicts the information from family regarding help around the clock per pt and  family report he lives alone [place]    Exercises        Assessment/Plan    PT Assessment Patient needs continued PT services  PT Diagnosis Difficulty walking   PT Problem List Decreased strength;Decreased range of motion;Decreased activity tolerance;Decreased balance;Decreased mobility;Decreased coordination;Decreased cognition;Decreased knowledge of use of DME;Decreased safety awareness;Decreased knowledge of precautions;Cardiopulmonary status limiting activity;Obesity  PT Treatment Interventions DME instruction;Gait training;Functional mobility training;Therapeutic activities;Therapeutic exercise;Balance training;Neuromuscular re-education;Patient/family education   PT Goals (Current goals can be found in the Care Plan section) Acute Rehab PT Goals Patient Stated Goal: to go home PT Goal Formulation: With patient Time For Goal Achievement: 05/24/14 Potential to Achieve Goals: Good    Frequency Min 3X/week   Barriers to discharge Inaccessible home environment;Decreased caregiver support Cannot fully confirm his help at home    Co-evaluation                End of Session Equipment Utilized During Treatment: Gait belt;Oxygen Activity Tolerance: Patient limited by fatigue Patient left: in chair;with call bell/phone within reach;with chair alarm set;with nursing/sitter in room Nurse Communication: Mobility status;Precautions         Time: 0916-0950 PT Time Calculation (min): 34 min   Charges:   PT Evaluation $Initial PT Evaluation Tier I: 1 Procedure PT Treatments $Gait Training: 8-22 mins $Therapeutic Activity: 8-22 mins   PT G Codes:          Ivar Drape 2014/06/08, 10:27 AM  Samul Dada, PT MS Acute Rehab Dept. Number: [phone]

## 2014-05-17 NOTE — Plan of Care (Signed)
Problem: Acute Rehab PT Goals(only PT should resolve) Goal: Pt Will Transfer Bed To Chair/Chair To Bed Verbally only

## 2014-05-18 DIAGNOSIS — I369 Nonrheumatic tricuspid valve disorder, unspecified: Secondary | ICD-10-CM

## 2014-05-18 LAB — URINE CULTURE
COLONY COUNT: NO GROWTH
CULTURE: NO GROWTH

## 2014-05-18 LAB — BASIC METABOLIC PANEL
ANION GAP: 12 (ref 5–15)
BUN: 34 mg/dL — ABNORMAL HIGH (ref 6–23)
CHLORIDE: 98 meq/L (ref 96–112)
CO2: 35 meq/L — AB (ref 19–32)
CREATININE: 1.4 mg/dL — AB (ref 0.50–1.35)
Calcium: 8.7 mg/dL (ref 8.4–10.5)
GFR calc Af Amer: 49 mL/min — ABNORMAL LOW (ref >90)
GFR calc non Af Amer: 42 mL/min — ABNORMAL LOW (ref >90)
Glucose, Bld: 123 mg/dL — ABNORMAL HIGH (ref 70–99)
Potassium: 4.2 mEq/L (ref 3.7–5.3)
SODIUM: 145 meq/L (ref 137–147)

## 2014-05-18 MED ORDER — IPRATROPIUM-ALBUTEROL 0.5-2.5 (3) MG/3ML IN SOLN
3.0000 mL | Freq: Two times a day (BID) | RESPIRATORY_TRACT | Status: DC
  Administered 2014-05-18 – 2014-05-20 (×4): 3 mL via RESPIRATORY_TRACT
  Filled 2014-05-18 (×4): qty 3

## 2014-05-18 MED ORDER — ENOXAPARIN SODIUM 40 MG/0.4ML ~~LOC~~ SOLN
40.0000 mg | SUBCUTANEOUS | Status: DC
  Administered 2014-05-18 – 2014-05-19 (×2): 40 mg via SUBCUTANEOUS
  Filled 2014-05-18 (×3): qty 0.4

## 2014-05-18 NOTE — Progress Notes (Signed)
PATIENT DETAILS Name: Dominic Lopez Age: 80 y.o. Sex: male Date of Birth: 1921-01-11 Admit Date: 05/16/2014 Admitting Physician Ripudeep Jenna Luo, MD ZOX:[id number],JWJX, MD  Subjective: No major issues overnight.  Assessment/Plan: Principal Problem:   HCAP (healthcare-associated pneumonia) - Admitted, started on vancomycin and cefepime-currently day 3. - Remains afebrile, no leukocytosis.Improving - Influenza panel negative, blood cultures pending, urine streptococcal antigen negative, urine Legionella antigen negative. - Continue with antibiotics, follow clinical course  Active Problems:  Acute on chronic systolic heart failure - Continue with IV Lasix and Coreg, seems to be much more compensated than on admission.However weight stagnant-will give one extra dose of IV Lasix today-still has edema in legs - Suspect not a candidate for ACE inhibitors given chronic kidney disease stage III - Daily weights, strict intake and output - Await 2-D echocardiogram  Acute hypoxic respiratory failure - Multifactorial-from likely above 2 issues - Treated with antibiotics, Lasix, and other supportive care. Wean off oxygen as tolerated. - Lower extremity Doppler negative for DVT, doubt pulmonary embolism-will not commence further workup.  Chronic kidney disease stage III - Monitor creatinine, currently close to her usual baseline.  Hypertension - Moderately controlled, continue with Coreg and Lasix.  History of CVA - Continue with aspirin, and statins  History of seizure disorder - Resume Tegretol  OSA -CPAP qhs    Pacemaker   Disposition: Remain inpatient  DVT Prophylaxis: Prophylactic Lovenox   Code Status: Full code  Family Communication None at bedside-called son-[name]-[phone] to leave voice mail  Procedures:  None  CONSULTS:  None   MEDICATIONS: Scheduled Meds: . carbamazepine  100 mg Oral TID  . carvedilol  3.125 mg Oral BID WC  .  ceFEPime (MAXIPIME) IV  1 g Intravenous Q24H  . enoxaparin (LOVENOX) injection  30 mg Subcutaneous Q24H  . feeding supplement (ENSURE COMPLETE)  237 mL Oral Q0600  . furosemide  40 mg Intravenous Daily  . ipratropium-albuterol  3 mL Nebulization BID  . ofloxacin  1 drop Left Eye QID  . potassium chloride  20 mEq Oral Daily  . sodium chloride  3 mL Intravenous Q12H  . vancomycin  1,250 mg Intravenous Q24H   Continuous Infusions:  PRN Meds:.sodium chloride, acetaminophen, albuterol, ondansetron (ZOFRAN) IV, sodium chloride  Antibiotics: Anti-infectives   Start     Dose/Rate Route Frequency Ordered Stop   05/16/14 2200  ceFEPIme (MAXIPIME) 1 g in dextrose 5 % 50 mL IVPB  Status:  Discontinued     1 g 100 mL/hr over 30 Minutes Intravenous 3 times per day 05/16/14 1808 05/16/14 1813   05/16/14 2000  ceFEPIme (MAXIPIME) 1 g in dextrose 5 % 50 mL IVPB     1 g 100 mL/hr over 30 Minutes Intravenous Every 24 hours 05/16/14 1813 05/24/14 1959   05/16/14 1900  vancomycin (VANCOCIN) 1,250 mg in sodium chloride 0.9 % 250 mL IVPB     1,250 mg 166.7 mL/hr over 90 Minutes Intravenous Every 24 hours 05/16/14 1819     05/16/14 1415  cefTRIAXone (ROCEPHIN) 1 g in dextrose 5 % 50 mL IVPB     1 g 100 mL/hr over 30 Minutes Intravenous  Once 05/16/14 1403 05/16/14 1525   05/16/14 1415  doxycycline (VIBRA-TABS) tablet 100 mg     100 mg Oral  Once 05/16/14 1403 05/16/14 1447       PHYSICAL EXAM: Vital signs in last 24 hours: Filed Vitals:   05/17/14 2153 05/18/14 0630 05/18/14  0908 05/18/14 0930  BP: 133/80 128/80 140/80   Pulse: 70 66 66   Temp: 98.4 F (36.9 C) 98.3 F (36.8 C)    TempSrc: Oral Oral    Resp: 18 18    Height:      Weight:  103.4 kg (227 lb 15.3 oz)    SpO2: 96% 96%  95%    Weight change: 12.701 kg (28 lb) Filed Weights   05/16/14 1223 05/17/14 1354 05/18/14 0630  Weight: 90.719 kg (200 lb) 103.42 kg (228 lb) 103.4 kg (227 lb 15.3 oz)   Body mass index is 32.71 kg/(m^2).    Gen Exam: Awake, somewhat alert- but very hard of hearing.  Neck: Supple, No JVD.   Chest: Bibasilar rales, but otherwise clear CVS: S1 S2 Regular Abdomen: soft, BS +, non tender, non distended.  Extremities: 1-2+ edema, lower extremities warm to touch. Neurologic: Non Focal- difficult exam-very hard of hearing- but seems to be moving all 4 extremities Skin: No Rash.   Wounds: N/A.   Intake/Output from previous day:  Intake/Output Summary (Last 24 hours) at 05/18/14 1034 Last data filed at 05/18/14 0947  Gross per 24 hour  Intake    637 ml  Output   1000 ml  Net   -363 ml     LAB RESULTS: CBC  Recent Labs Lab 05/16/14 1222 05/17/14 0650  WBC 7.5 7.7  HGB 10.7* 10.6*  HCT 33.8* 33.2*  PLT 290 267  MCV 108.3* 107.4*  MCH 34.3* 34.3*  MCHC 31.7 31.9  RDW 14.0 14.1  LYMPHSABS 0.7  --   MONOABS 0.7  --   EOSABS 0.1  --   BASOSABS 0.0  --     Chemistries   Recent Labs Lab 05/16/14 1222 05/17/14 0650 05/18/14 0550  NA 140 143 145  K 4.2 3.8 4.2  CL 96 97 98  CO2 36* 35* 35*  GLUCOSE 169* 112* 123*  BUN 31* 30* 34*  CREATININE 1.40* 1.33 1.40*  CALCIUM 7.9* 7.9* 8.7    CBG: No results found for this basename: GLUCAP,  in the last 168 hours  GFR Estimated Creatinine Clearance: 41.4 ml/min (by C-G formula based on Cr of 1.4).  Coagulation profile No results found for this basename: INR, PROTIME,  in the last 168 hours  Cardiac Enzymes No results found for this basename: CK, CKMB, TROPONINI, MYOGLOBIN,  in the last 168 hours  No components found with this basename: POCBNP,  No results found for this basename: DDIMER,  in the last 72 hours No results found for this basename: HGBA1C,  in the last 72 hours No results found for this basename: CHOL, HDL, LDLCALC, TRIG, CHOLHDL, LDLDIRECT,  in the last 72 hours No results found for this basename: TSH, T4TOTAL, FREET3, T3FREE, THYROIDAB,  in the last 72 hours No results found for this basename:  VITAMINB12, FOLATE, FERRITIN, TIBC, IRON, RETICCTPCT,  in the last 72 hours No results found for this basename: LIPASE, AMYLASE,  in the last 72 hours  Urine Studies No results found for this basename: UACOL, UAPR, USPG, UPH, UTP, UGL, UKET, UBIL, UHGB, UNIT, UROB, ULEU, UEPI, UWBC, URBC, UBAC, CAST, CRYS, UCOM, BILUA,  in the last 72 hours  MICROBIOLOGY: Recent Results (from the past 240 hour(s))  URINE CULTURE     Status: None   Collection Time    05/16/14 11:00 PM      Result Value Ref Range Status   Specimen Description URINE, CLEAN CATCH   Final  Special Requests NONE   Final   Culture  Setup Time     Final   Value: 05/16/2014 23:35     Performed at [facility]   Colony Count     Final   Value: NO GROWTH     Performed at [facility]   Culture     Final   Value: NO GROWTH     Performed at [facility]   Report Status 05/18/2014 FINAL   Final    RADIOLOGY STUDIES/RESULTS: Dg Chest Port 1 View  05/16/2014   CLINICAL DATA:  Dyspnea  EXAM: PORTABLE CHEST - 1 VIEW  COMPARISON:  01/30/2014  FINDINGS: Cardiac shadow remains enlarged. A 2 lead pacing device is again noted bilateral pleural effusions are seen but improved from the prior study. Persistent atelectasis is noted in the right lung base. No acute bony abnormality is noted.  IMPRESSION: Persistent right basilar atelectasis. Small pleural effusions are noted bilaterally. The right-sided effusion has improved somewhat from the prior exam.   Electronically Signed   By: Alcide Clever M.D.   On: 05/16/2014 13:34    Jeoffrey Massed, MD  [facility] Pager:[phone]  If 7PM-7AM, please contact night-coverage [email or web address] Password [email or web address] 05/18/2014, 10:34 AM   LOS: 2 days   **Disclaimer: This note may have been dictated with voice recognition software. Similar sounding words can inadvertently be transcribed and this note may contain transcription errors which may not have been corrected upon  publication of note.**

## 2014-05-18 NOTE — Progress Notes (Signed)
  Echocardiogram 2D Echocardiogram has been performed.  Dominic Lopez 05/18/2014, 9:06 AM

## 2014-05-19 DIAGNOSIS — R5381 Other malaise: Secondary | ICD-10-CM

## 2014-05-19 LAB — BASIC METABOLIC PANEL
ANION GAP: 8 (ref 5–15)
BUN: 35 mg/dL — ABNORMAL HIGH (ref 6–23)
CO2: 37 mEq/L — ABNORMAL HIGH (ref 19–32)
Calcium: 8.5 mg/dL (ref 8.4–10.5)
Chloride: 100 mEq/L (ref 96–112)
Creatinine, Ser: 1.37 mg/dL — ABNORMAL HIGH (ref 0.50–1.35)
GFR calc Af Amer: 50 mL/min — ABNORMAL LOW (ref >90)
GFR calc non Af Amer: 43 mL/min — ABNORMAL LOW (ref >90)
Glucose, Bld: 118 mg/dL — ABNORMAL HIGH (ref 70–99)
Potassium: 4.2 mEq/L (ref 3.7–5.3)
Sodium: 145 mEq/L (ref 137–147)

## 2014-05-19 MED ORDER — FUROSEMIDE 10 MG/ML IJ SOLN
40.0000 mg | Freq: Two times a day (BID) | INTRAMUSCULAR | Status: DC
  Administered 2014-05-19 – 2014-05-20 (×3): 40 mg via INTRAVENOUS
  Filled 2014-05-19 (×4): qty 4

## 2014-05-19 MED ORDER — POTASSIUM CHLORIDE CRYS ER 20 MEQ PO TBCR
20.0000 meq | EXTENDED_RELEASE_TABLET | Freq: Two times a day (BID) | ORAL | Status: DC
  Administered 2014-05-19 – 2014-05-20 (×3): 20 meq via ORAL
  Filled 2014-05-19 (×3): qty 1

## 2014-05-19 NOTE — Care Management Note (Signed)
    Page 1 of 1   05/20/2014     11:04:26 AM CARE MANAGEMENT NOTE 05/20/2014  Patient:  IGNATIUS, KLOOS   Account Number:  [id number]  Date Initiated:  05/19/2014  Documentation initiated by:  Letha Cape  Subjective/Objective Assessment:   dx hcap, chf  admit- lives with caregiver     Action/Plan:   pt eval- rec snf   Anticipated DC Date:  05/20/2014   Anticipated DC Plan:  SKILLED NURSING FACILITY  In-house referral  Clinical Social Worker      DC Planning Services  CM consult      Choice offered to / List presented to:             Status of service:  Completed, signed off Medicare Important Message given?  YES (If response is "NO", the following Medicare IM given date fields will be blank) Date Medicare IM given:  05/19/2014 Medicare IM given by:  Letha Cape Date Additional Medicare IM given:   Additional Medicare IM given by:    Discharge Disposition:  SKILLED NURSING FACILITY  Per UR Regulation:  Reviewed for med. necessity/level of care/duration of stay  If discussed at Long Length of Stay Meetings, dates discussed:    Comments:  05/20/14 1103 Letha Cape RN, BSN [phone] patient is for dc to [facility] today.  05/19/14 1554 Letha Cape RN, BSN [phone] patient is for for dc to snf tomorrow, CSW referral.

## 2014-05-19 NOTE — Progress Notes (Signed)
ANTIBIOTIC CONSULT NOTE - Follow Up  Pharmacy Consult for Cefepime  Indication: pneumonia  Allergies  Allergen Reactions  . Penicillins Nausea And Vomiting    Patient Measurements: Height:  (177.8 cm) Weight: 224 lb 13.9 oz (102 kg) IBW/kg (Calculated) : 73 Adjusted Body Weight:   Vital Signs: Temp: 97.5 F (36.4 C) (08/31 0610) Temp src: Axillary (08/31 0610) BP: 170/95 mmHg (08/31 0610) Pulse Rate: 62 (08/31 0610) Intake/Output from previous day: 08/30 0701 - 08/31 0700 In: 237 [P.O.:237] Out: 1300 [Urine:1300] Intake/Output from this shift: Total I/O In: 50 [P.O.:50] Out: -   Labs:  Recent Labs  05/16/14 1222 05/17/14 0650 05/18/14 0550 05/19/14 0616  WBC 7.5 7.7  --   --   HGB 10.7* 10.6*  --   --   PLT 290 267  --   --   CREATININE 1.40* 1.33 1.40* 1.37*   Estimated Creatinine Clearance: 42 ml/min (by C-G formula based on Cr of 1.37). No results found for this basename: VANCOTROUGH, VANCOPEAK, VANCORANDOM, GENTTROUGH, GENTPEAK, GENTRANDOM, TOBRATROUGH, TOBRAPEAK, TOBRARND, AMIKACINPEAK, AMIKACINTROU, AMIKACIN,  in the last 72 hours   Assessment: 80 yo who was admitted for SOB x 1 wk. Had PNA back in May. He was started on zithromax outpt last week. Currently on D#4 of IV Vancomycin and cefepime. WBC wnl. Cultures remain negative. CrCl ~ 40 mL/min.   8/28 UA: no growth FINAL 8/28 BCX x2: ngtd  8/28 vanc>>8/31 8/28 cefepime>>   Goal of Therapy:  Resolution of infection   Plan:  -Stop Vancomycin per hospitalist  -Continue Cefepime 1 gm IV Q 24 hours -Monitor cultures, renal fx and s/s of improvement   Vinnie Level, PharmD.  Clinical Pharmacist Pager [phone]

## 2014-05-19 NOTE — Progress Notes (Signed)
Pharmacist Heart Failure Core Measure Documentation  Assessment: Dominic Lopez has an EF documented as 25-30% on 05/18/14 by 2D ECHO.  Rationale: Heart failure patients with left ventricular systolic dysfunction (LVSD) and an EF < 40% should be prescribed an angiotensin converting enzyme inhibitor (ACEI) or angiotensin receptor blocker (ARB) at discharge unless a contraindication is documented in the medical record.  This patient is not currently on an ACEI or ARB for HF.  This note is being placed in the record in order to provide documentation that a contraindication to the use of these agents is present for this encounter.  ACE Inhibitor or Angiotensin Receptor Blocker is contraindicated (specify all that apply)    ACEI allergy AND ARB allergy   Angioedema   Moderate or severe aortic stenosis   Hyperkalemia   Hypotension   Renal artery stenosis   Worsening renal function, preexisting renal disease or dysfunction   Vinnie Level, PharmD.  Clinical Pharmacist Pager [phone]

## 2014-05-19 NOTE — Clinical Social Work Placement (Signed)
Clinical Social Work Department CLINICAL SOCIAL WORK PLACEMENT NOTE 05/19/2014  Patient:  Dominic Lopez, Dominic Lopez  Account Number:  [id number] Admit date:  05/16/2014  Clinical Social Worker:  Cherre Blanc, [place]  Date/time:  05/19/2014 04:26 PM  Clinical Social Work is seeking post-discharge placement for this patient at the following level of care:   SKILLED NURSING   (*CSW will update this form in Epic as items are completed)   05/19/2014  Patient/family provided with [facility] Department of Clinical Social Work's list of facilities offering this level of care within the geographic area requested by the patient (or if unable, by the patient's family).  05/19/2014  Patient/family informed of their freedom to choose among providers that offer the needed level of care, that participate in Medicare, Medicaid or managed care program needed by the patient, have an available bed and are willing to accept the patient.  05/19/2014  Patient/family informed of [facility]' ownership interest in [facility], as well as of the fact that they are under no obligation to receive care at this facility.  PASARR submitted to EDS on 05/19/2014 PASARR number received on 05/19/2014  FL2 transmitted to all facilities in geographic area requested by pt/family on  05/19/2014 FL2 transmitted to all facilities within larger geographic area on   Patient informed that his/her managed care company has contracts with or will negotiate with  certain facilities, including the following:     Patient/family informed of bed offers received:   Patient chooses bed at  Physician recommends and patient chooses bed at    Patient to be transferred to  on   Patient to be transferred to facility by  Patient and family notified of transfer on  Name of family member notified:    The following physician request were entered in Epic:   Additional Comments:    Roddie Mc MSW, [place], [place],  [phone]

## 2014-05-19 NOTE — Clinical Social Work Psychosocial (Signed)
Clinical Social Work Department BRIEF PSYCHOSOCIAL ASSESSMENT 05/19/2014  Patient:  Dominic Lopez, Dominic Lopez     Account Number:  [id number]     Admit date:  05/16/2014  Clinical Social Worker:  Lavell Luster  Date/Time:  05/19/2014 01:02 PM  Referred by:  Physician  Date Referred:  05/19/2014 Referred for  SNF Placement   Other Referral:   Interview type:  Family Other interview type:   Patient's son Dominic Lopez interviewed to complete assessment.    PSYCHOSOCIAL DATA Living Status:  ALONE Admitted from facility:   Level of care:   Primary support name:  Dominic Lopez Primary support relationship to patient:  CHILD, ADULT Degree of support available:   Support is strong. Son reports that patient has been living at home with 24/7 caregivers.    CURRENT CONCERNS Current Concerns  Post-Acute Placement   Other Concerns:    SOCIAL WORK ASSESSMENT / PLAN CSW spoke with patient's son Dominic Lopez over phone to complete assessment. Dominic Lopez states that the patient has been living at home with 24/7 caregivers. Dominic Lopez understands that PT has recommendeded CIR, SNF, or home with 24/7 supervision and asssistance. Dominic Lopez states that his first preference will be CIR, home with supervision/asssitance as second preference, but is open to SNF as backup. CSW explained that CIR may not be an option for the patient and that family needs to be prepared to make other arrangements. CSW explained SNF search/placement process and answered questions.   Assessment/plan status:  Psychosocial Support/Ongoing Assessment of Needs Other assessment/ plan:   Complete Fl2, Fax, PASRR   Information/referral to community resources:   CSW contact information and SNF list given.    PATIENT'S/FAMILY'S RESPONSE TO PLAN OF CARE: Patient's son Dominic Lopez plans for patient to DC to CIR or home with 24/7 supervision/assistance, but Dominic Lopez is open to SNF placement if necessary. CSW will follow up with bed offers once available.       Roddie Mc MSW, [place], [place], [phone]

## 2014-05-19 NOTE — Progress Notes (Signed)
Pt is appropriate for [facility] with caregiver support or  SNF. Not a candidate for CIR. I have notified SW. [phone]

## 2014-05-19 NOTE — Progress Notes (Signed)
1330 Read PT's note. Pt is not appropriate for our services at this time. When pt is ambulating safely with safety device, we will begin seeing. Will continue to follow. Luetta Nutting RN BSN 05/19/2014 1:31 PM

## 2014-05-19 NOTE — Progress Notes (Signed)
Patient was screened by Shir Bergman for appropriateness for an Inpatient Acute Rehab consult.  At this time, we are recommending Inpatient Rehab consult.  Please order when you feel appropriate.   Londen Bok PT Inpatient Rehab Admissions Coordinator Cell [phone] Office [phone]  

## 2014-05-19 NOTE — Progress Notes (Signed)
Physical Therapy Treatment Patient Details Name: Dominic Lopez MRN: [id number] DOB: 19-Mar-1921 Today's Date: 05/19/2014    History of Present Illness Patient is a 80 year old male with a prior history of pneumonia in May, 2015, hypertension, pacemaker, CHF EF unknown, presented to ED with shortness of breath for last week. Wears oxygen 2 L at night however has been using oxygen 24/7.     PT Comments    Pt progressing slowly towards physical therapy goals. Was able to perform bed mobility and transition bed>chair with decreased assistance, however continues to require +2 assist, especially for any ambulation. Do not feel that pt is appropriate for CIR at this time, and d/c recommendations updated to SNF only. Will continue to follow and progress as able per POC.   Follow Up Recommendations  SNF;Supervision/Assistance - 24 hour     Equipment Recommendations  None recommended by PT    Recommendations for Other Services       Precautions / Restrictions Precautions Precautions: Fall;ICD/Pacemaker Restrictions Weight Bearing Restrictions: No    Mobility  Bed Mobility Overal bed mobility: Needs Assistance Bed Mobility: Rolling;Sidelying to Sit Rolling: Mod assist Sidelying to sit: Mod assist       General bed mobility comments: VC's for hand placement on seated surface for safety. Pt was able to perform rolling and tranfer to sitting with overall mod assist. Verbal cueing was difficulty due to [facility], however pt respons well to tactile cues and slow speech. Pt rolling x5 each side in bed for linen change and to be cleaned up by NT.   Transfers Overall transfer level: Needs assistance Equipment used: Rolling walker (2 wheeled) Transfers: Sit to/from [employer] Sit to Stand: Mod assist;+2 physical assistance Stand pivot transfers: Min assist;+2 safety/equipment       General transfer comment: VC's for hand placement on seated surface for safety. Pt required  assist to power-up to full standing position, and then states "I can do it" when cued to pivot around to the chair. Pt was able to take pivotal steps around to the recliner. Cueing for pt to reach back for recliner, however began to sit without warning and required assist to control his descent to the chair.   Ambulation/Gait                 Stairs            Wheelchair Mobility    Modified Rankin (Stroke Patients Only)       Balance Overall balance assessment: Needs assistance Sitting-balance support: Feet supported;Bilateral upper extremity supported Sitting balance-Leahy Scale: Poor Sitting balance - Comments: Pt with difficulty keeping L foot on the floor. Pt requires UE support to maintain sitting balance.    Standing balance support: Bilateral upper extremity supported;During functional activity Standing balance-Leahy Scale: Poor                      Cognition Arousal/Alertness: Awake/alert Behavior During Therapy: WFL for tasks assessed/performed Overall Cognitive Status: Within Functional Limits for tasks assessed                      Exercises      General Comments        Pertinent Vitals/Pain Pain Assessment: No/denies pain    Home Living                      Prior Function  PT Goals (current goals can now be found in the care plan section) Acute Rehab PT Goals Patient Stated Goal: to go home PT Goal Formulation: With patient Time For Goal Achievement: 05/24/14 Potential to Achieve Goals: Good Progress towards PT goals: Progressing toward goals    Frequency  Min 3X/week    PT Plan Current plan remains appropriate    Co-evaluation             End of Session Equipment Utilized During Treatment: Gait belt;Oxygen Activity Tolerance: Patient limited by fatigue Patient left: in chair;with call bell/phone within reach;with chair alarm set;with nursing/sitter in room     Time: 1610-9604 PT Time  Calculation (min): 36 min  Charges:  $Therapeutic Activity: 23-37 mins                    G Codes:      Ruthann Cancer 2014/06/16, 12:21 PM  Ruthann Cancer, PT, DPT Acute Rehabilitation Services Pager: [phone]

## 2014-05-19 NOTE — Progress Notes (Signed)
Speech Language Pathology Treatment: Dysphagia  Patient Details Name: Dominic Lopez MRN: [id number] DOB: 13-Sep-1921 Today's Date: 05/19/2014 Time: 7829-5621 SLP Time Calculation (min): 12 min  Assessment / Plan / Recommendation Clinical Impression  Pt. Required min-moderate verbal/visual cues for small sips; stated he preferred not to use straws.  Delayed throat clears possibly from varying textures simultaneously and possible decreased oral cohesion.  Functional mastication with solid.  SLP recommends continue regular texture and thin liquids, preferably no straws and no further ST needed at this time.   HPI HPI: 80 year old male with a prior history of pneumonia in May, 2015, hypertension, pacemaker, CHF admitted with shortness of breath. CXR Persistent right basilar atelectasis. Small pleural effusions are noted bilaterally. The right-sided effusion has improved somewhat from the prior exam. Per MD note daughter-in-law, he had prior aspiration issues and doesn't follow aspiration precautions.  SLP did not find documentation of ST involvement in EPIC.  Diagnosed with healthcare-associated pna.   Pertinent Vitals Pain Assessment: No/denies pain  SLP Plan  Discharge SLP treatment due to (comment)    Recommendations Diet recommendations: Regular;Thin liquid Liquids provided via: Cup;Straw Medication Administration: Whole meds with puree Supervision: Patient able to self feed;Intermittent supervision to cue for compensatory strategies Compensations: Slow rate;Small sips/bites Postural Changes and/or Swallow Maneuvers: Out of bed for meals;Seated upright 90 degrees              Oral Care Recommendations: Oral care BID Follow up Recommendations: None Plan: Discharge SLP treatment due to (comment)    GO     [name] 05/19/2014, 1:15 PM  [phone]

## 2014-05-19 NOTE — Consult Note (Signed)
Physical Medicine and Rehabilitation Consult Reason for Consult: Debility Referring Physician: Dr. Jerral Ralph   HPI: Dominic Lopez is a 80 y.o. male with history of HTN, CHF, Seizure disorder, H/o dysphagia, recent PNA who was started on Zithromax by PCP few days PTA on 05/16/14 with SOB X 1 week, increase oxygen needs and no improvement on po antibiotics. He was started on IV Vanc and Cefepime for HCAP as well as IV diuresis for acute exacerbation of chronic CHF. Swallow evaluation done and aspiration precautions recommended with supervision to maintain precautions. 2D echo with EF 25-30 % with grade 3 diastolic dysfunction, bi-atrial dilatation. PT evaluation done and patient with poor safety awareness, decreased posture as well as deconditioning. MD and family requesting CIR for follow up therapy.    Review of Systems  Unable to perform ROS: other     Past Medical History  Diagnosis Date  . CHF (congestive heart failure)   . Hypertension   . Pacemaker   . High cholesterol   . Pneumonia 2014 X 1; 02/2014; 05/16/2014  . On home oxygen therapy     " 2L q hs" (05/16/2014)  . OSA on CPAP     "I think he's on 3L forced ventilation" (05/16/2014)   Past Surgical History  Procedure Laterality Date  . Cataract extraction, bilateral Bilateral   . Brain surgery  1990's    "cut a nerve on left side of face @ [facility]; no feeling on left side since"  . Insert / replace / remove pacemaker  2000's   History reviewed. No pertinent family history.  Social History:   Lives with family? Reports that he has caregivers around the clock (3 who assist with ADLs as well as home/meals management). Per reports was independent for ambulation with RW. Per reports  that he has never smoked. He has never used smokeless tobacco. Per reports that he does not drink alcohol or use illicit drugs.   Allergies  Allergen Reactions  . Penicillins Nausea And Vomiting   Medications Prior to Admission  Medication  Sig Dispense Refill  . aspirin EC 81 MG tablet Take 81 mg by mouth daily.      Marland Kitchen atorvastatin (LIPITOR) 10 MG tablet Take 10 mg by mouth daily.      . carbamazepine (TEGRETOL) 100 MG chewable tablet Chew 100-200 mg by mouth 2 (two) times daily. Take 1 tablet by mouth in AM and 2 tablets by mouth in PM      . carvedilol (COREG) 25 MG tablet Take 25 mg by mouth 2 (two) times daily.      Marland Kitchen docusate sodium (COLACE) 100 MG capsule Take 100 mg by mouth 2 (two) times daily.      . fluticasone (FLONASE) 50 MCG/ACT nasal spray Place 1 spray into both nostrils 2 (two) times daily.      . furosemide (LASIX) 20 MG tablet Take 20-40 mg by mouth 3 (three) times daily as needed for fluid or edema.       Marland Kitchen loratadine (CLARITIN) 10 MG tablet Take 10 mg by mouth daily.      . Multiple Vitamins-Minerals (PRESERVISION/LUTEIN) CAPS Take 1 capsule by mouth 2 (two) times daily.      . polyethylene glycol powder (GLYCOLAX/MIRALAX) powder Take 17 g by mouth daily as needed for mild constipation.         Home: Home Living Family/patient expects to be discharged to:: Private residence Living Arrangements: Alone;Other (Comment) (Has several caregivers per  pt) Available Help at Discharge: Available 24 hours/day (Per pt) Type of Home: House Home Access: Ramped entrance Home Layout: One level Home Equipment: Walker - 2 wheels;Cane - single point;Bedside commode;Tub bench;Wheelchair - manual  Functional History: Prior Function Level of Independence: Needs assistance Gait / Transfers Assistance Needed: walker ADL's / Homemaking Assistance Needed: help with household Communication / Swallowing Assistance Needed: none x  hearing  aid and had precautions for dysphagia, non compliant per family Functional Status:  Mobility: Bed Mobility Overal bed mobility: Needs Assistance Bed Mobility: Sidelying to Sit Sidelying to sit: Mod assist General bed mobility comments: cues for hand placement and sequencing, requires tactile  cues to initiate Transfers Overall transfer level: Needs assistance Equipment used: Rolling walker (2 wheeled) Transfers: Sit to/from [employer] Sit to Stand: Mod assist Stand pivot transfers: Mod assist General transfer comment: cued hand placement, cues for assisting lines and to  initiate the task, needs remindersabout  the goal of the activity after first request. Flexed posture and knees, slow sidesteps with  no awareness of safety for hand placement and to control descent  Ambulation/Gait Ambulation/Gait assistance: Mod assist Ambulation Distance (Feet): 3 Feet Assistive device: Rolling walker (2 wheeled) Gait Pattern/deviations: Step-to pattern;Decreased dorsiflexion - right;Decreased dorsiflexion - left;Shuffle;Wide base of support;Trunk flexed Gait velocity: slowed Gait velocity interpretation: Below normal speed for age/gender General Gait Details: flexed posture and knees, slow pace with limited awareness of  safety and the direction, goal and use of walker    ADL:    Cognition: Cognition Overall Cognitive Status: Impaired/Different from baseline Orientation Level: Oriented to person;Oriented to place;Disoriented to time;Oriented to situation Cognition Arousal/Alertness: Awake/alert Behavior During Therapy: WFL for tasks assessed/performed Overall Cognitive Status: Impaired/Different from baseline Area of Impairment: Orientation;Attention;Memory;Following commands;Safety/judgement;Awareness;Problem solving Orientation Level: Time Current Attention Level: Divided Memory: Decreased recall of precautions;Decreased short-term memory Following Commands: Follows one step commands inconsistently Safety/Judgement: Decreased awareness of safety Awareness: Intellectual;Anticipatory Problem Solving: Slow processing;Decreased initiation;Difficulty sequencing;Requires verbal cues;Requires tactile cues  Blood pressure 170/95, pulse 62, temperature 97.5 F (36.4  C), temperature source Axillary, resp. rate 18, height  (1.778 m), weight 102 kg (224 lb 13.9 oz), SpO2 96.00%. Physical Exam  Nursing note and vitals reviewed. Constitutional: He appears well-developed and well-nourished. Nasal cannula in place.  Elderly male. Slumped in chair.   HENT:  Head: Normocephalic and atraumatic.  Edentulous.  Eyes: Conjunctivae are normal. Pupils are equal, round, and reactive to light.  Neck: Normal range of motion.  Cardiovascular: Normal rate and regular rhythm.   Respiratory: Effort normal. He has wheezes (audible upper airway).  GI: Soft. Bowel sounds are normal. He exhibits no distension. There is no tenderness.  Musculoskeletal:  1+ edema LLE.   Neurological: He is alert.  Oriented to self and situation globally. Place-- "Rehab"/[place]. Disoriented and focused on going home and "getting back to work". Extremely HOH with mild dysarthria. Able to move all four. No gross sensory deficits. Slow to process.  Skin: Skin is warm and dry.  Psychiatric:  cooperative    Results for orders placed during the hospital encounter of 05/16/14 (from the past 24 hour(s))  BASIC METABOLIC PANEL     Status: Abnormal   Collection Time    05/19/14  6:16 AM      Result Value Ref Range   Sodium 145  137 - 147 mEq/L   Potassium 4.2  3.7 - 5.3 mEq/L   Chloride 100  96 - 112 mEq/L   CO2 37 (*) 19 -  32 mEq/L   Glucose, Bld 118 (*) 70 - 99 mg/dL   BUN 35 (*) 6 - 23 mg/dL   Creatinine, Ser 2.95 (*) 0.50 - 1.35 mg/dL   Calcium 8.5  8.4 - 62.1 mg/dL   GFR calc non Af Amer 43 (*) >90 mL/min   GFR calc Af Amer 50 (*) >90 mL/min   Anion gap 8  5 - 15   No results found.  Assessment/Plan: Diagnosis: deconditioning related to pneumonia, CHF 1. Does the need for close, 24 hr/day medical supervision in concert with the patient's rehab needs make it unreasonable for this patient to be served in a less intensive setting? No 2. Co-Morbidities requiring  supervision/potential complications: chf, htn 3. Due to bladder management, bowel management, safety, skin/wound care, disease management, medication administration, pain management and patient education, does the patient require 24 hr/day rehab nursing? No 4. Does the patient require coordinated care of a physician, rehab nurse, PT, oT to address physical and functional deficits in the context of the above medical diagnosis(es)? Potentially Addressing deficits in the following areas: balance, endurance, locomotion, strength, transferring, bowel/bladder control, bathing, feeding, grooming and toileting 5. Can the patient actively participate in an intensive therapy program of at least 3 hrs of therapy per day at least 5 days per week? No 6. The potential for patient to make measurable gains while on inpatient rehab is poor 7. Anticipated functional outcomes upon discharge from inpatient rehab are n/a  with PT, n/a with OT, n/a with SLP. 8. Estimated rehab length of stay to reach the above functional goals is: n/a 9. Does the patient have adequate social supports to accommodate these discharge functional goals? N/A 10. Anticipated D/C setting: Other 11. Anticipated post D/C treatments: HH therapy 12. Overall Rehab/Functional Prognosis: good and fair  RECOMMENDATIONS: This patient's condition is appropriate for continued rehabilitative care in the following setting: SNF and HH Therapy Patient has agreed to participate in recommended program. Yes Note that insurance prior authorization may be required for reimbursement for recommended care.  Comment: Pt has full time caregivers at home. If they are unable to provide current level of assistance, then family will need to pursue SNF.  Ranelle Oyster, MD, [facility] Physical Medicine & Rehabilitation     05/19/2014

## 2014-05-19 NOTE — Progress Notes (Signed)
PATIENT DETAILS Name: Dominic Lopez Age: 80 y.o. Sex: male Date of Birth: [birth year] Admit Date: 05/16/2014 Admitting Physician Ripudeep Jenna Luo, MD ZOX:[id number],[id number], MD  Subjective: No major issues overnight.Sleeping comfortably this am-denies any complaints  Assessment/Plan: Principal Problem:   HCAP (healthcare-associated pneumonia) - Admitted, started on vancomycin and cefepime-currently day 4. Since remains afebrile, no leukocytosis and Improving with negative cultures-will stop Vanco on 8/31, and just continue with Cefepime - Influenza panel negative, blood cultures pending, urine streptococcal antigen negative, urine Legionella antigen negative. - Follow clinical course  Active Problems:  Acute on chronic systolic heart failure - Continue with IV Lasix and Coreg, seems to be much more compensated than on admission.However weight stagnant-will c/w IV Lasix-but change BID today-still has edema in legs - Suspect not a candidate for ACE inhibitors given chronic kidney disease stage III - Daily weights, strict intake and output - 2-D echocardiogram shows EF around 25 to 30 percent- reviewed notes from outpatient cardiology in care everywhere in [facility]-prior ejection fraction 25-30% as well. Echocardiogram also demonstrates significant pulmonary hypertension.  Acute hypoxic respiratory failure - Multifactorial-from likely above 2 issues - Treated with antibiotics, Lasix, and other supportive care. Wean off oxygen as tolerated. - Lower extremity Doppler negative for DVT, doubt pulmonary embolism-will not commence further workup.  Pulmonary hypertension - Likely secondary to chronic systolic heart failure. Supportive care.  Chronic kidney disease stage III - Monitor creatinine, currently close to her usual baseline.  Hypertension - Moderately controlled, continue with Coreg and Lasix.  History of CVA - Continue with aspirin, and statins  History of seizure disorder -  Resume Tegretol  OSA -CPAP qhs    Pacemaker   Disposition: Remain inpatient- CIR consult placed, if not a candidate for CIR, family open to SNF placement at [facility]  DVT Prophylaxis: Prophylactic Lovenox   Code Status: DNR-son Paul agreeable  Family Communication Son-Paul Lashley-[phone]-Explained plan-agreeable. Also agreeable to place DNR order.  Procedures:  None  CONSULTS:  None   MEDICATIONS: Scheduled Meds: . carbamazepine  100 mg Oral TID  . carvedilol  3.125 mg Oral BID WC  . ceFEPime (MAXIPIME) IV  1 g Intravenous Q24H  . enoxaparin (LOVENOX) injection  40 mg Subcutaneous Q24H  . feeding supplement (ENSURE COMPLETE)  237 mL Oral Q0600  . furosemide  40 mg Intravenous Q12H  . ipratropium-albuterol  3 mL Nebulization BID  . ofloxacin  1 drop Left Eye QID  . potassium chloride  20 mEq Oral BID  . sodium chloride  3 mL Intravenous Q12H   Continuous Infusions:  PRN Meds:.sodium chloride, acetaminophen, albuterol, ondansetron (ZOFRAN) IV, sodium chloride  Antibiotics: Anti-infectives   Start     Dose/Rate Route Frequency Ordered Stop   05/16/14 2200  ceFEPIme (MAXIPIME) 1 g in dextrose 5 % 50 mL IVPB  Status:  Discontinued     1 g 100 mL/hr over 30 Minutes Intravenous 3 times per day 05/16/14 1808 05/16/14 1813   05/16/14 2000  ceFEPIme (MAXIPIME) 1 g in dextrose 5 % 50 mL IVPB     1 g 100 mL/hr over 30 Minutes Intravenous Every 24 hours 05/16/14 1813 05/24/14 1959   05/16/14 1900  vancomycin (VANCOCIN) 1,250 mg in sodium chloride 0.9 % 250 mL IVPB  Status:  Discontinued     1,250 mg 166.7 mL/hr over 90 Minutes Intravenous Every 24 hours 05/16/14 1819 05/19/14 0918   05/16/14 1415  cefTRIAXone (ROCEPHIN) 1 g in dextrose 5 %  50 mL IVPB     1 g 100 mL/hr over 30 Minutes Intravenous  Once 05/16/14 1403 05/16/14 1525   05/16/14 1415  doxycycline (VIBRA-TABS) tablet 100 mg     100 mg Oral  Once 05/16/14 1403 05/16/14 1447       PHYSICAL  EXAM: Vital signs in last 24 hours: Filed Vitals:   05/18/14 2132 05/18/14 2149 05/19/14 0610 05/19/14 0927  BP:  146/77 170/95   Pulse: 68 60 62   Temp:  97.4 F (36.3 C) 97.5 F (36.4 C)   TempSrc:  Axillary Axillary   Resp:  18 18   Height:      Weight:    102 kg (224 lb 13.9 oz)  SpO2: 90% 98% 96%     Weight change:  Filed Weights   05/17/14 1354 05/18/14 0630 05/19/14 0927  Weight: 103.42 kg (228 lb) 103.4 kg (227 lb 15.3 oz) 102 kg (224 lb 13.9 oz)   Body mass index is 32.27 kg/(m^2).   Gen Exam: Awake, somewhat alert- but very hard of hearing.  Neck: Supple, No JVD.   Chest: Bibasilar rales, but otherwise clear CVS: S1 S2 Regular Abdomen: soft, BS +, non tender, non distended.  Extremities: 1-2+ edema, lower extremities warm to touch. Neurologic: Non Focal- difficult exam-very hard of hearing- but seems to be moving all 4 extremities Skin: No Rash.   Wounds: N/A.   Intake/Output from previous day:  Intake/Output Summary (Last 24 hours) at 05/19/14 1108 Last data filed at 05/19/14 0900  Gross per 24 hour  Intake     50 ml  Output   1300 ml  Net  -1250 ml     LAB RESULTS: CBC  Recent Labs Lab 05/16/14 1222 05/17/14 0650  WBC 7.5 7.7  HGB 10.7* 10.6*  HCT 33.8* 33.2*  PLT 290 267  MCV 108.3* 107.4*  MCH 34.3* 34.3*  MCHC 31.7 31.9  RDW 14.0 14.1  LYMPHSABS 0.7  --   MONOABS 0.7  --   EOSABS 0.1  --   BASOSABS 0.0  --     Chemistries   Recent Labs Lab 05/16/14 1222 05/17/14 0650 05/18/14 0550 05/19/14 0616  NA 140 143 145 145  K 4.2 3.8 4.2 4.2  CL 96 97 98 100  CO2 36* 35* 35* 37*  GLUCOSE 169* 112* 123* 118*  BUN 31* 30* 34* 35*  CREATININE 1.40* 1.33 1.40* 1.37*  CALCIUM 7.9* 7.9* 8.7 8.5    CBG: No results found for this basename: GLUCAP,  in the last 168 hours  GFR Estimated Creatinine Clearance: 42 ml/min (by C-G formula based on Cr of 1.37).  Coagulation profile No results found for this basename: INR, PROTIME,  in  the last 168 hours  Cardiac Enzymes No results found for this basename: CK, CKMB, TROPONINI, MYOGLOBIN,  in the last 168 hours  No components found with this basename: POCBNP,  No results found for this basename: DDIMER,  in the last 72 hours No results found for this basename: HGBA1C,  in the last 72 hours No results found for this basename: CHOL, HDL, LDLCALC, TRIG, CHOLHDL, LDLDIRECT,  in the last 72 hours No results found for this basename: TSH, T4TOTAL, FREET3, T3FREE, THYROIDAB,  in the last 72 hours No results found for this basename: VITAMINB12, FOLATE, FERRITIN, TIBC, IRON, RETICCTPCT,  in the last 72 hours No results found for this basename: LIPASE, AMYLASE,  in the last 72 hours  Urine Studies No results found for  this basename: UACOL, UAPR, USPG, UPH, UTP, UGL, UKET, UBIL, UHGB, UNIT, UROB, ULEU, UEPI, UWBC, URBC, UBAC, CAST, CRYS, UCOM, BILUA,  in the last 72 hours  MICROBIOLOGY: Recent Results (from the past 240 hour(s))  CULTURE, BLOOD (ROUTINE X 2)     Status: None   Collection Time    05/16/14  7:30 PM      Result Value Ref Range Status   Specimen Description BLOOD RIGHT WRIST   Final   Special Requests BOTTLES DRAWN AEROBIC AND ANAEROBIC 10CC EACH   Final   Culture  Setup Time     Final   Value: 05/17/2014 01:01     Performed at [facility]   Culture     Final   Value:        BLOOD CULTURE RECEIVED NO GROWTH TO DATE CULTURE WILL BE HELD FOR 5 DAYS BEFORE ISSUING A FINAL NEGATIVE REPORT     Performed at [facility]   Report Status PENDING   Incomplete  CULTURE, BLOOD (ROUTINE X 2)     Status: None   Collection Time    05/16/14  7:45 PM      Result Value Ref Range Status   Specimen Description BLOOD LEFT WRIST   Final   Special Requests BOTTLES DRAWN AEROBIC AND ANAEROBIC 5CC EACH   Final   Culture  Setup Time     Final   Value: 05/17/2014 01:00     Performed at [facility]   Culture     Final   Value:        BLOOD CULTURE RECEIVED  NO GROWTH TO DATE CULTURE WILL BE HELD FOR 5 DAYS BEFORE ISSUING A FINAL NEGATIVE REPORT     Performed at [facility]   Report Status PENDING   Incomplete  URINE CULTURE     Status: None   Collection Time    05/16/14 11:00 PM      Result Value Ref Range Status   Specimen Description URINE, CLEAN CATCH   Final   Special Requests NONE   Final   Culture  Setup Time     Final   Value: 05/16/2014 23:35     Performed at [facility] Count     Final   Value: NO GROWTH     Performed at [facility]   Culture     Final   Value: NO GROWTH     Performed at [facility]   Report Status 05/18/2014 FINAL   Final    RADIOLOGY STUDIES/RESULTS: Dg Chest Port 1 View  05/16/2014   CLINICAL DATA:  Dyspnea  EXAM: PORTABLE CHEST - 1 VIEW  COMPARISON:  01/30/2014  FINDINGS: Cardiac shadow remains enlarged. A 2 lead pacing device is again noted bilateral pleural effusions are seen but improved from the prior study. Persistent atelectasis is noted in the right lung base. No acute bony abnormality is noted.  IMPRESSION: Persistent right basilar atelectasis. Small pleural effusions are noted bilaterally. The right-sided effusion has improved somewhat from the prior exam.   Electronically Signed   By: Alcide Clever M.D.   On: 05/16/2014 13:34    Jeoffrey Massed, MD  [facility] Pager:[phone]  If 7PM-7AM, please contact night-coverage [email or web address] Password [email or web address] 05/19/2014, 11:08 AM   LOS: 3 days   **Disclaimer: This note may have been dictated with voice recognition software. Similar sounding words can inadvertently be transcribed and this note may contain transcription errors which  may not have been corrected upon publication of note.**

## 2014-05-20 ENCOUNTER — Ambulatory Visit: Payer: Self-pay | Admitting: Internal Medicine

## 2014-05-20 ENCOUNTER — Encounter: Payer: Self-pay | Admitting: Internal Medicine

## 2014-05-20 LAB — BASIC METABOLIC PANEL
Anion gap: 6 (ref 5–15)
BUN: 33 mg/dL — ABNORMAL HIGH (ref 6–23)
CO2: 38 meq/L — AB (ref 19–32)
Calcium: 8.1 mg/dL — ABNORMAL LOW (ref 8.4–10.5)
Chloride: 97 mEq/L (ref 96–112)
Creatinine, Ser: 1.31 mg/dL (ref 0.50–1.35)
GFR calc Af Amer: 53 mL/min — ABNORMAL LOW (ref >90)
GFR calc non Af Amer: 46 mL/min — ABNORMAL LOW (ref >90)
Glucose, Bld: 115 mg/dL — ABNORMAL HIGH (ref 70–99)
POTASSIUM: 3.6 meq/L — AB (ref 3.7–5.3)
SODIUM: 141 meq/L (ref 137–147)

## 2014-05-20 MED ORDER — FUROSEMIDE 20 MG PO TABS: 40.0000 mg | ORAL_TABLET | Freq: Two times a day (BID) | ORAL | Status: AC

## 2014-05-20 MED ORDER — POTASSIUM CHLORIDE CRYS ER 20 MEQ PO TBCR: 20.0000 meq | EXTENDED_RELEASE_TABLET | Freq: Two times a day (BID) | ORAL | Status: AC

## 2014-05-20 MED ORDER — LEVOFLOXACIN 750 MG PO TABS: 750.0000 mg | ORAL_TABLET | ORAL | Status: AC

## 2014-05-20 MED ORDER — ALBUTEROL SULFATE (2.5 MG/3ML) 0.083% IN NEBU: 2.5000 mg | INHALATION_SOLUTION | RESPIRATORY_TRACT | Status: AC | PRN

## 2014-05-20 MED ORDER — CARVEDILOL 25 MG PO TABS
12.5000 mg | ORAL_TABLET | Freq: Two times a day (BID) | ORAL | Status: AC
Start: 2014-05-20 — End: ?

## 2014-05-20 MED ORDER — ENSURE COMPLETE PO LIQD: 237.0000 mL | Freq: Every day | ORAL | Status: AC

## 2014-05-20 MED ORDER — LEVOFLOXACIN 750 MG PO TABS
750.0000 mg | ORAL_TABLET | ORAL | Status: DC
Start: 2014-05-20 — End: 2014-05-20
  Filled 2014-05-20: qty 1

## 2014-05-20 MED ORDER — IPRATROPIUM-ALBUTEROL 0.5-2.5 (3) MG/3ML IN SOLN: 3.0000 mL | Freq: Two times a day (BID) | RESPIRATORY_TRACT | Status: AC

## 2014-05-20 NOTE — Clinical Social Work Placement (Addendum)
Clinical Social Work Department CLINICAL SOCIAL WORK PLACEMENT NOTE 05/20/2014  Patient:  Dominic Lopez, Dominic Lopez  Account Number:  [id number] Admit date:  05/16/2014  Clinical Social Worker:  Cherre Blanc, [place]  Date/time:  05/19/2014 04:26 PM  Clinical Social Work is seeking post-discharge placement for this patient at the following level of care:   SKILLED NURSING   (*CSW will update this form in Epic as items are completed)   05/19/2014  Patient/family provided with [facility] Department of Clinical Social Work's list of facilities offering this level of care within the geographic area requested by the patient (or if unable, by the patient's family).  05/19/2014  Patient/family informed of their freedom to choose among providers that offer the needed level of care, that participate in Medicare, Medicaid or managed care program needed by the patient, have an available bed and are willing to accept the patient.  05/19/2014  Patient/family informed of [facility]' ownership interest in [facility], as well as of the fact that they are under no obligation to receive care at this facility.  PASARR submitted to EDS on 05/19/2014 PASARR number received on 05/19/2014  FL2 transmitted to all facilities in geographic area requested by pt/family on  05/19/2014 FL2 transmitted to all facilities within larger geographic area on   Patient informed that his/her managed care company has contracts with or will negotiate with  certain facilities, including the following:     Patient/family informed of bed offers received:  05/20/2014 Patient chooses bed at [facility] Physician recommends and patient chooses bed at    Patient to be transferred to [facility] on  05/20/2014 Patient to be transferred to facility by Ambulance Patient and family notified of transfer on 05/20/2014 Name of family member notified:  Renae Fickle  The following physician request were entered in  Epic:   Additional Comments: Per MD patient ready for DC to [facility]. RN, patient, patient's family, and facility notified of DC. RN given number for report. DC packet on chart. [facility] transport requested for patient. CSW signing off.    Roddie Mc MSW, [place], [place], [phone]

## 2014-05-20 NOTE — Progress Notes (Signed)
Patient was discharged to nursing home [facility])  by MD order; discharged instructions  review and to facility with care notes; IV DIC; skin - abrasion on both arms - foam dressing in place; redness on sacrum - foam dressing in place; oxygen 3L/min via [place]; patient will be transported to facility via EMS. Facility was called and report was given to nurse Stevphen [place].

## 2014-05-20 NOTE — Discharge Summary (Signed)
PATIENT DETAILS Name: Dominic Lopez Age: 80 y.o. Sex: male Date of Birth: Feb 14, 1921 MRN: [id number]. Admitting Physician: Cathren Harsh, MD MWU:[id number],UVOZ, MD  Admit Date: 05/16/2014 Discharge date: 05/20/2014  Recommendations for Outpatient Follow-up:  1. Please repeat electrolytes in 3-5 days. 2. Palliative care and evaluation while at skilled nursing facility 3. Please repeat chest x-ray in 4-6 weeks to document resolution of the pneumonia.  PRIMARY DISCHARGE DIAGNOSIS:  Principal Problem:   HCAP (healthcare-associated pneumonia) Active Problems:   Acute respiratory failure with hypoxia   Pacemaker   Hypertension   Systolic CHF, acute on chronic      PAST MEDICAL HISTORY: Past Medical History  Diagnosis Date  . CHF (congestive heart failure)   . Hypertension   . Pacemaker   . High cholesterol   . Pneumonia 2014 X 1; 02/2014; 05/16/2014  . On home oxygen therapy     " 2L q hs" (05/16/2014)  . OSA on CPAP     "I think he's on 3L forced ventilation" (05/16/2014)    DISCHARGE MEDICATIONS:   Medication List         albuterol (2.5 MG/3ML) 0.083% nebulizer solution  Commonly known as:  PROVENTIL  Take 3 mLs (2.5 mg total) by nebulization every 2 (two) hours as needed for wheezing or shortness of breath.     aspirin EC 81 MG tablet  Take 81 mg by mouth daily.     atorvastatin 10 MG tablet  Commonly known as:  LIPITOR  Take 10 mg by mouth daily.     carbamazepine 100 MG chewable tablet  Commonly known as:  TEGRETOL  Chew 100-200 mg by mouth 2 (two) times daily. Take 1 tablet by mouth in AM and 2 tablets by mouth in PM     carvedilol 25 MG tablet  Commonly known as:  COREG  Take 0.5 tablets (12.5 mg total) by mouth 2 (two) times daily with a meal.     docusate sodium 100 MG capsule  Commonly known as:  COLACE  Take 100 mg by mouth 2 (two) times daily.     feeding supplement (ENSURE COMPLETE) Liqd  Take 237 mLs by mouth daily at 6 (six) AM.     fluticasone 50 MCG/ACT nasal spray  Commonly known as:  FLONASE  Place 1 spray into both nostrils 2 (two) times daily.     furosemide 20 MG tablet  Commonly known as:  LASIX  Take 2 tablets (40 mg total) by mouth 2 (two) times daily.     ipratropium-albuterol 0.5-2.5 (3) MG/3ML Soln  Commonly known as:  DUONEB  Take 3 mLs by nebulization 2 (two) times daily.     levofloxacin 750 MG tablet  Commonly known as:  LEVAQUIN  Take 1 tablet (750 mg total) by mouth every other day. Take for 2 more doses from 05/20/14 and then stop     loratadine 10 MG tablet  Commonly known as:  CLARITIN  Take 10 mg by mouth daily.     polyethylene glycol powder powder  Commonly known as:  GLYCOLAX/MIRALAX  Take 17 g by mouth daily as needed for mild constipation.     potassium chloride SA 20 MEQ tablet  Commonly known as:  K-DUR,KLOR-CON  Take 1 tablet (20 mEq total) by mouth 2 (two) times daily.     PRESERVISION/LUTEIN Caps  Take 1 capsule by mouth 2 (two) times daily.        ALLERGIES:   Allergies  Allergen Reactions  .  Penicillins Nausea And Vomiting    BRIEF HPI:  See H&P, Labs, Consult and Test reports for all details in brief, patientis a 80 year old male with a prior history of pneumonia in May, 2015, hypertension, pacemaker, CHF EF unknown, presented to ED with shortness of breath.  CONSULTATIONS:   None  PERTINENT RADIOLOGIC STUDIES: Dg Chest Port 1 View  05/16/2014   CLINICAL DATA:  Dyspnea  EXAM: PORTABLE CHEST - 1 VIEW  COMPARISON:  01/30/2014  FINDINGS: Cardiac shadow remains enlarged. A 2 lead pacing device is again noted bilateral pleural effusions are seen but improved from the prior study. Persistent atelectasis is noted in the right lung base. No acute bony abnormality is noted.  IMPRESSION: Persistent right basilar atelectasis. Small pleural effusions are noted bilaterally. The right-sided effusion has improved somewhat from the prior exam.   Electronically Signed   By: Alcide Clever M.D.   On: 05/16/2014 13:34     PERTINENT LAB RESULTS: CBC: No results found for this basename: WBC, HGB, HCT, PLT,  in the last 72 hours CMET CMP     Component Value Date/Time   NA 141 05/20/2014 0538   K 3.6* 05/20/2014 0538   CL 97 05/20/2014 0538   CO2 38* 05/20/2014 0538   GLUCOSE 115* 05/20/2014 0538   BUN 33* 05/20/2014 0538   CREATININE 1.31 05/20/2014 0538   CALCIUM 8.1* 05/20/2014 0538   PROT 6.7 05/16/2014 1222   ALBUMIN 2.7* 05/16/2014 1222   AST 15 05/16/2014 1222   ALT 15 05/16/2014 1222   ALKPHOS 242* 05/16/2014 1222   BILITOT 0.5 05/16/2014 1222   GFRNONAA 46* 05/20/2014 0538   GFRAA 53* 05/20/2014 0538    GFR Estimated Creatinine Clearance: 44.4 ml/min (by C-G formula based on Cr of 1.31). No results found for this basename: LIPASE, AMYLASE,  in the last 72 hours No results found for this basename: CKTOTAL, CKMB, CKMBINDEX, TROPONINI,  in the last 72 hours No components found with this basename: POCBNP,  No results found for this basename: DDIMER,  in the last 72 hours No results found for this basename: HGBA1C,  in the last 72 hours No results found for this basename: CHOL, HDL, LDLCALC, TRIG, CHOLHDL, LDLDIRECT,  in the last 72 hours No results found for this basename: TSH, T4TOTAL, FREET3, T3FREE, THYROIDAB,  in the last 72 hours No results found for this basename: VITAMINB12, FOLATE, FERRITIN, TIBC, IRON, RETICCTPCT,  in the last 72 hours Coags: No results found for this basename: PT, INR,  in the last 72 hours Microbiology: Recent Results (from the past 240 hour(s))  CULTURE, BLOOD (ROUTINE X 2)     Status: None   Collection Time    05/16/14  7:30 PM      Result Value Ref Range Status   Specimen Description BLOOD RIGHT WRIST   Final   Special Requests BOTTLES DRAWN AEROBIC AND ANAEROBIC 10CC EACH   Final   Culture  Setup Time     Final   Value: 05/17/2014 01:01     Performed at [facility]   Culture     Final   Value:        BLOOD CULTURE RECEIVED NO  GROWTH TO DATE CULTURE WILL BE HELD FOR 5 DAYS BEFORE ISSUING A FINAL NEGATIVE REPORT     Performed at [facility]   Report Status PENDING   Incomplete  CULTURE, BLOOD (ROUTINE X 2)     Status: None   Collection Time  05/16/14  7:45 PM      Result Value Ref Range Status   Specimen Description BLOOD LEFT WRIST   Final   Special Requests BOTTLES DRAWN AEROBIC AND ANAEROBIC 5CC EACH   Final   Culture  Setup Time     Final   Value: 05/17/2014 01:00     Performed at [facility]   Culture     Final   Value:        BLOOD CULTURE RECEIVED NO GROWTH TO DATE CULTURE WILL BE HELD FOR 5 DAYS BEFORE ISSUING A FINAL NEGATIVE REPORT     Performed at [facility]   Report Status PENDING   Incomplete  URINE CULTURE     Status: None   Collection Time    05/16/14 11:00 PM      Result Value Ref Range Status   Specimen Description URINE, CLEAN CATCH   Final   Special Requests NONE   Final   Culture  Setup Time     Final   Value: 05/16/2014 23:35     Performed at [facility] Count     Final   Value: NO GROWTH     Performed at [facility]   Culture     Final   Value: NO GROWTH     Performed at [facility]   Report Status 05/18/2014 FINAL   Final     BRIEF HOSPITAL COURSE:   Principal Problem: HCAP (healthcare-associated pneumonia)  - Admitted, started on vancomycin and cefepime. Improved without fever,no leukocytosis. Negative cultures-will stop IV vancomycin and cefepime and transition to Levaquin on discharge - Influenza panel negative, blood cultures negative so far, urine streptococcal antigen negative, urine Legionella antigen negative.   Active Problems: Acute on chronic systolic heart failure - Admitted and started on IV Lasix. Significant improvement, less shortness of breath. Rate essentially the same. Was on IV Lasix 40 mg twice a day, would change to oral Lasix 40 mg twice a day along with KCl supplementation on discharge.  Very frail elderly patient, not a candidate for ACE inhibitors given the chronic kidney disease stage III. Given his frailty, would not be very aggressive with diuretic regimen, as patient is very minimially ambulatory, and does not appear to be short of breath currently. - 2-D echocardiogram shows EF around 25 to 30 percent- reviewed notes from outpatient cardiology in care everywhere in [facility]-prior ejection fraction 25-30% as well. Echocardiogram also demonstrates significant pulmonary hypertension. - Suspect would benefit from a palliative care consultation while at skilled nursing facility placement, suspect that he will benefit from transition to residential hospice placement if he has any further decompensation. After discussion with son, and daughter-in-law, DO NOT RESUSCITATE agreed upon.  Acute on chronic hypoxic respiratory failure  - Multifactorial-from likely above 2 issues. Apparently was on nocturnal oxygen at home, requiring oxygen 24/70 prior to this hospitalization - Treated with antibiotics, Lasix, and other supportive care. Wean off oxygen as tolerated as tolerated - Lower extremity Doppler negative for DVT, doubt pulmonary embolism-will not commence further workup.   Pulmonary hypertension  - Likely secondary to chronic systolic heart failure. Supportive care.   Chronic kidney disease stage III  - Monitor creatinine, currently close to her usual baseline.   Hypertension  - Moderately controlled, continue with Coreg and Lasix.   History of CVA  - Continue with aspirin, and statins   History of seizure disorder  - Resume Tegretol   OSA  -CPAP  qhs   Pacemaker   TODAY-DAY OF DISCHARGE:  Subjective:   Aslan Himes today continues to remain stable. Patient is very hard of hearing.  Objective:   Blood pressure 150/85, pulse 60, temperature 98.6 F (37 C), temperature source Oral, resp. rate 20, height  (1.778 m), weight 104 kg (229 lb 4.5 oz), SpO2  99.00%.  Intake/Output Summary (Last 24 hours) at 05/20/14 1015 Last data filed at 05/20/14 0948  Gross per 24 hour  Intake    310 ml  Output    800 ml  Net   -490 ml   Filed Weights   05/18/14 0630 05/19/14 0927 05/20/14 0550  Weight: 103.4 kg (227 lb 15.3 oz) 102 kg (224 lb 13.9 oz) 104 kg (229 lb 4.5 oz)    Exam Awake Alert, Oriented *3, No new F.N deficits, Normal affect [place].AT,PERRAL Supple Neck,No JVD, No cervical lymphadenopathy appriciated.  Symmetrical Chest wall movement, Good air movement bilaterally, CTAB RRR,No Gallops,Rubs or new Murmurs, No Parasternal Heave +ve B.Sounds, Abd Soft, Non tender, No organomegaly appriciated, No rebound -guarding or rigidity. No Cyanosis, Clubbing or 1-2 + ankle edema, No new Rash or bruise  DISCHARGE CONDITION: Stable  DISPOSITION: SNF   DISCHARGE INSTRUCTIONS:    Activity:  As tolerated with Full fall precautions use walker/cane & assistance as needed  Diet recommendation: Heart Healthy diet Fluid restriction 1.5 lit/day Aspiration precautions:yes      Discharge Instructions   (HEART FAILURE PATIENTS) Call MD:  Anytime you have any of the following symptoms: 1) 3 pound weight gain in 24 hours or 5 pounds in 1 week 2) shortness of breath, with or without a dry hacking cough 3) swelling in the hands, feet or stomach 4) if you have to sleep on extra pillows at night in order to breathe.    Complete by:  As directed      Call MD for:  temperature >100.4    Complete by:  As directed      Diet - low sodium heart healthy    Complete by:  As directed      Increase activity slowly    Complete by:  As directed            Follow-up Information   Follow up with Vonita Moss, MD. Schedule an appointment as soon as possible for a visit in 1 week. (after discharge from SNF)    Specialty:  Family Medicine   Contact information:   [address] [place] [address] [phone]       Follow up with Marcina Millard, MD.  Schedule an appointment as soon as possible for a visit in 1 week.   Specialty:  Internal Medicine   Contact information:   [facility] [address] [place] [address] [phone]       Total Time spent on discharge equals 45 minutes.  SignedJeoffrey Massed 05/20/2014 10:15 AM  **Disclaimer: This note may have been dictated with voice recognition software. Similar sounding words can inadvertently be transcribed and this note may contain transcription errors which may not have been corrected upon publication of note.**

## 2014-05-23 LAB — CULTURE, BLOOD (ROUTINE X 2)
Culture: NO GROWTH
Culture: NO GROWTH

## 2014-06-19 DEATH — deceased

## 2014-07-06 ENCOUNTER — Encounter (HOSPITAL_BASED_OUTPATIENT_CLINIC_OR_DEPARTMENT_OTHER): Payer: Medicare Other

## 2015-01-09 NOTE — H&P (Signed)
PATIENT NAME:  Dominic Lopez, Anuel MR#:  [id number] DATE OF BIRTH:  [birth year]  DATE OF ADMISSION:  03/06/2013  PRIMARY CARE PHYSICIAN: Dr. Vonita MossMark Crissman.   REFERRING ER PHYSICIAN: Dr. Margarita GrizzleWoodruff.   CHIEF COMPLAINT: Fall, and dehydration.   HISTORY OF PRESENT ILLNESS: A 81 year old male who lives alone, has a caretaker and a friend. He is able to lead his active daily life, following regularly with his doctors and cardiologist. Has a history of a pacemaker, hypertension, trigeminal neuralgia. For the last few days family noticed that he has become more weak. Usually his routine is he goes for a walk 2 to 3 times a week and he keeps himself very well hydrated and drinking enough water, eating  hygienic food, but for the last few days he looks weak and he has trouble transferring himself from chair to bed. Yesterday night when he was going to get milk from his refrigerator  while suddenly turning at the refrigerator to put the milk back he fell down on the floor and he hit his emergency call button. EMS arrived to the house. He denies any chest pain, shortness of breath or palpitations with this episode. He denies any loss of consciousness during this episode, or any involuntary movements. When EMS arrived they found him okay but due to his age were planning to take him to the Emergency Room, but the patient said he was fine and he did not want to come to the Emergency Room, so EMS suggested going to the primary care physician Wednesday morning, which he did. Today he went to his primary care physician's office and his blood pressure was noticed 80/50, and his initial lab work was done noted dehydration, and so he was sent to the Emergency Room for further workup. On initial ER workup his creatinine was noted to be 2.3, which is high for him, and his blood pressure was running 90/50 in the ER, which was 80/50 at primary care physician's office.   REVIEW OF SYSTEMS: Negative for any fever, fatigue, but has  generalized weakness. No pain or weight loss.  EYES: Negative for blurring, double vision or redness or inflammation.  EARS, NOSE, THROAT: No tinnitus, ear pain or hearing loss.  RESPIRATORY: No cough, wheezing, hemoptysis or dyspnea.  CARDIOVASCULAR: No chest pain, orthopnea, edema or arrhythmia with these episodes.  GASTROINTESTINAL: No nausea, vomiting, diarrhea,  GENITOURINARY: No dysuria, hematuria or increased frequency of urination.   ENDOCRINE: No polyuria, nocturia or heat or cold intolerance.  SKIN: No acne, rashes or lesions on the skin.  MUSCULOSKELETAL: No pain or swelling on the joints.  NEUROLOGICAL: No numbness, weakness, tremors or vertigo.  PSYCHIATRIC: No anxiety, insomnia or bipolar disorder.   PAST MEDICAL HISTORY:  1.  Colon polyp, colonoscopy 2007.  2.  Status post pacemaker, September 2010, due to bradycardia.  3.  Macular degeneration.  4.  Trigeminal neuralgia, status post repair x 2.  5.  Hypertension.   PAST SURGICAL HISTORY: Trigeminal neuralgia surgery, and pacemaker implantation.   ALLERGY TO PENICILLIN.   FAMILY HISTORY: No diabetes, heart attack, stroke, or hypertension. Reported that one of his sisters is [id number], and another is 79 years old.   SOCIAL HISTORY: Lives alone in [place]. Denies any trauma. No alcohol or drug use.   HOME MEDICATIONS:  1.  Tegretol, extended-release 200 mg oral 2 times a day.  2.  Hydrochlorothiazide plus Losartan, 12.5/100 mg oral tablet once a day.  3.  Furosemide 20 mg once a day.  4.  Carvedilol 25 mg 2 times a day.  5.  Clonidine 0.1 mg oral tablet 2 times a day.  6.  Aspirin 81 mg once a day.  7.  Amlodipine 5 mg once a day.   PHYSICAL EXAMINATION: VITAL SIGNS: Temperature 98.4, pulse rate 65, respirations 18, blood pressure 90/53 on arrival to ER, and pulse ox 95% on room air.  GENERAL: The patient is fully alert, oriented to time, place, and person, and he is cooperative with history-taking and examination.  Appears appropriate for his age.  HEENT: Head and neck atraumatic. Conjunctivae pink. Oral mucosa moist.  NECK: Supple. No JVD. Hearing is slightly  impaired and we had to speak loud for him to hear properly.  RESPIRATORY: Bilaterally clear and equal air entry.  CARDIOVASCULAR: S1, S2 present, regular. No murmur appreciated.  ABDOMEN: Soft, nontender. Bowel sounds present. No organomegaly.  SKIN: No rashes.  JOINTS: No tenderness or swelling.  LEGS: No edema.  NEUROLOGICAL: Moves all four limbs. Power 4/5. No tremors or rigidity.   LAB RESULTS:  Glucose 100, BUN 43, creatinine 2.3; in the past creatinine was 1.3 in September 2010. We do not have any immediate results.   Sodium 137, potassium 3.7, chloride 102, CO2 28, calcium 8.0, total protein 6.7, albumin 3.8, bilirubin 0.6, alkaline phosphate 88, SGOT 41, SGPT 25.  Troponin 0.05. WBC 13.6, hemoglobin 11.1, platelet count is 211, and MCV 102.   Urinalysis is negative.   CT head without contrast: Chronic and involutional changes, without evidence of acute abnormality. Past history clinical concern for further evaluation, and MRI would be  recommended.   X-ray, chest PA and lateral: No acute cardiopulmonary disease.   ASSESSMENT AND PLAN:  A 81 year old male with a history of a permanent pacemaker due to bradycardia and CHF, which is questionable save that Dr. Darrold Junker was following for this issue and he was told that he had congestive heart failure; we do not have anything in our records. Hypertension. Macular degeneration. Had a fall yesterday after lightheadedness, and found having dehydration and hypotension at PMDs office today, so sent over for that. He was taking multiple blood pressure medications, including furosemide, hydrochlorothiazide, carvedilol; that might be the cause of this issue. Urinalysis is negative.   1.  Acute renal failure on chronic kidney injury: Baseline creatinine was 1.3 as per 3 years ago  records. Will hold  his diuretics for now, gave him IV fluid normal saline, 100 mL per hour, and we will monitor his blood pressure. Will also get orthostatic blood pressure recordings for our records and to see that if he is orthostatically hypotensive or not.  2.  Hypotension: Will hold all blood pressure medications for now. Currently, there is not any source of infection or any other reason for his blood pressure to drop other than his excessive medication use. Will stop all of them and see for the blood pressure to come up and give IV fluids.  3. Fall: Possibly this is secondary to a drop in the blood pressure as he had not had  any episodes of palpitations or passing out, but will keep him on telemetry for monitoring of an irregular heart rhythm. Will also get physical therapy evaluation for his fall and generalized weakness for the last few weeks.  4.  Trigeminal neuralgia: The patient was taking Tegretol. We will continue that here.  5.  Congestive heart failure: We do not have any records in our system saying congestive heart failure, but he was following with  Dr. Darrold Junker, and caretaker and daughter-in-law says that he was told that he has congestive heart failure. Will get echocardiogram over here, but right now because of his hypotension and dehydration he needs to have IV fluids. Currently chest x-ray is clear and there are no signs of exacerbation on physical examination.  6. Macrocytic anemia: Will check vitamin B12 level.   CODE STATUS: FULL CODE.   Total time spent: Fifty minutes in this admission.    ____________________________ Hope Pigeon Elisabeth Pigeon, MD vgv:dm D: 03/06/2013 14:34:15 ET T: 03/06/2013 14:57:49 ET JOB#: [id number]  cc: Hope Pigeon. Elisabeth Pigeon, MD, <Dictator> Steele Sizer, MD Altamese Dilling MD ELECTRONICALLY SIGNED 03/26/2013 14:19

## 2015-01-09 NOTE — Discharge Summary (Signed)
PATIENT NAME:  Dominic Lopez, Dominic Lopez MR#:  [id number] DATE OF BIRTH:  [birth year]  DATE OF ADMISSION:  03/06/2013 DATE OF DISCHARGE:  03/08/2013  DISCHARGE DIAGNOSES:  1, Dehydration, acute on chronic renal failure, improved as a result of diuretic use.  2. Weakness and fall due to hypotension, now improved with intravenous fluids.  3. Hypokalemia, replaced.  4. Macrocytic anemia.   CONDITION ON DISCHARGE: Stable.   CODE STATUS: FULL CODE.   MEDICATIONS ON DISCHARGE:  1. Aspirin 81 mg once a day.  2. Tegretol extended release 200 mg oral tablet 2 times a day.   ACTIVITY LIMITATION: As tolerated.   TIMEFRAME TO FOLLOWUP: In 1 to 2 weeks with primary care physician. Advised to follow with primary care physician for checking kidney function. We took off all blood pressure meds and diuretics as he was dehydrated and hypotensive. Reconsider starting BP meds if BP remains high. PMD visit to arrange his outpatient physical therapy.   PRIMARY CARE PHYSICIAN: Dr. Vonita MossMark Crissman.   HISTORY OF PRESENTING ILLNESS: This 81 year old male lives at home, has a caretaker and a friend. He is able to do his activities of daily life. Following regularly with his doctor and cardiologist. Has a history of pacemaker, hypertension, trigeminal neuralgia. For the last few days, family noticed that he became more weak. Usually, his routine is to go for a walk 2 to 3 times a week and keeps himself very well hydrated and drinking enough water, eating hygienic food. For the last few days, he looked weak and he had trouble transferring himself from chair to bed. Yesterday night when he was going to get milk from the refrigerator, he suddenly felt extremely weak and fell down on the floor. The patient denied any chest pain, shortness of breath or palpitation with this episode. He went to his primary care physician's office the next day, and he noticed blood pressure was 80/50. In the ER, initial blood work noted having dehydration.  Blood pressure was still running 80/50 and 90/50. He was admitted with dehydration and hypotension.   ASSESSMENT AND PLAN:  1. Acute renal failure on chronic kidney injury: Intravenous fluid was continued. His BMP was followed baseline creatinine was 1.33. Gradually, his creatinine was coming down satisfactorily, so we discharged home. He was advised to follow with his primary care physician.  2. Hypotension which improved when we held all of the hypertension medication.  3. Fall: PT evaluation was done. They suggested him to go home.  4. Macrocytic anemia: Hemoglobin was down, possibly due to intravenous fluid. Followed vitamin B12 level.  5. Hypokalemia: Potassium chloride was given and followed BMP and was getting corrected, so we discharged home.   IMPORTANT LAB RESULTS: On presentation WBC was 13,600, hemoglobin was 11.1 and platelet count 211. Glucose was 100, BUN 43 and creatinine 2.3, sodium 137, potassium 3.7, chloride 102 and CO2 28. Troponin 0.05. CT head without contrast was without any significant findings. Urinalysis was grossly negative. Creatinine level came to 1.93 the next day, and later on it came to 1.76 and 1.60.   TOTAL TIME SPENT ON THIS DISCHARGE: 45 minutes.   ____________________________ Hope PigeonVaibhavkumar G. Elisabeth PigeonVachhani, MD vgv:gb D: 03/10/2013 23:48:32 ET T: 03/11/2013 01:08:40 ET JOB#: [id number]  cc: Hope PigeonVaibhavkumar G. Elisabeth PigeonVachhani, MD, <Dictator> Steele SizerMark A. Crissman, MD Altamese DillingVAIBHAVKUMAR Gladys Deckard MD ELECTRONICALLY SIGNED 03/26/2013 14:21

## 2015-01-10 NOTE — Consult Note (Signed)
   Comments   I received a call from SW regarding family concerns that I had suggested a family meeting to clarify pt's goals including code status. I called and spoke with pt's son, Renae Fickleaul. I apologized for any misunderstanding. Renae Fickleaul says family has talked with patient and his wish is to remain a full code. Family do not want the term "DNR" mentioned to patient. Family remain optimistic regarding pt's clinical improvement and a return to his former baseline.  and I agreed that there is no further need for a family meeting today. Will sign off.     Electronic Signatures: Graelyn Bihl, Daryl EasternJoshua R (NP)  (Signed 01-May-15 12:08)  Authored: Palliative Care   Last Updated: 01-May-15 12:08 by Malachy MoanBorders, Marq Rebello R (NP)

## 2015-01-10 NOTE — Discharge Summary (Signed)
PATIENT NAME:  Dominic Lopez, Dominic Lopez MR#:  [id number] DATE OF BIRTH:  [birth year]  DATE OF ADMISSION:  01/12/2014 DATE OF DISCHARGE:  01/22/2014  PRIMARY CARE PHYSICIAN:  Dr. Vonita MossMark Crissman.   DISCHARGE DIAGNOSES:   1.  Acute on chronic hypoxic hypercarbic respiratory failure due to right lower lobe pneumonia, suspect aspiration variety, along with right pleural effusion due to acute on chronic diastolic heart failure.  2.  Seizure disorder.  3.  Generalized weakness, likely multifactorial.  4.  Aspiration pneumonia.   SECONDARY DIAGNOSES:  1.  Diverticulosis.  2.  Macular degeneration.  3.  Trigeminal neuralgia.   4.  Hypertension.   CONSULTATION:   1.  Physical therapy.  2.  Palliative care, Dr. Harvie JuniorPhifer.    PROCEDURES AND RADIOLOGY: As dictated by Dr. Winona LegatoVaickute on the interim discharge summary on the 5th May. No new procedures/radiology were obtained subsequent to that.   HISTORY AND SHORT HOSPITAL COURSE: The patient is a 81 year old male with above-mentioned medical problems, who was admitted for fever and weakness. Please see Dr. Eddie NorthSudini's dictated history and physical for further details. Please also note interim discharge summary dictated by Dr. Winona LegatoVaickute on the 5th of May for detailed course from admission until 5th of May.  At this point, the patient continues to require significant amount of oxygen and does remain at very high risk for recurrent aspiration. He remains on 4 liters of oxygen by nasal cannula and, at times, also requires BiPAP. He is being discharged to long-term acute care facility as per family request. He does have a bed available at [facility] and is being discharged there for long-term care.  Total time taking care of this patient was 45 minutes.   Please note, the patient's CODE STATUS IS BEING CHANGED TO DNR. The patient's son, Jonny RuizJohn, along with other family members Renae Fickle(Paul) are all in agreement for the same.    Diet: Dysphagia I (Puree) with Honey thick liquids. Please send  extra cranberry juice on all meal trays; please send Magic Cup at [employer]; Please send HONEY Thick MightyShakes TID on meal trays. Aspiration Precauation at all times.  ____________________________ Ellamae SiaVipul S. Sherryll BurgerShah, MD vss:dmm D: 01/22/2014 10:17:45 ET T: 01/22/2014 10:35:28 ET JOB#: [id number]  cc: Alayne Estrella S. Sherryll BurgerShah, MD, <Dictator> Ellamae SiaVIPUL S [facility] MD ELECTRONICALLY SIGNED 01/27/2014 0:35

## 2015-01-10 NOTE — H&P (Signed)
PATIENT NAME:  DIEGO, ULBRICHT MR#:  [id number] DATE OF BIRTH:  1920-10-06  DATE OF ADMISSION:  01/12/2014  PRIMARY CARE PHYSICIAN: Steele Sizer, MD  CHIEF COMPLAINT: Fever, weakness.   HISTORY OF PRESENT ILLNESS: A 81 year old male patient with history of diastolic congestive heart failure, diabetes, seizures, hypertension who at baseline ambulates with a walker and does not have any dementia per family, presents to the Emergency Room with fever and weakness. The patient has been feeling weaker over the last 2 to 3 weeks, was being treated with azithromycin for sinusitis. Since yesterday, family noticed that the patient had some fever although this was not checked. He felt warm. Had been weaker than normal and very drowsy, sleepy. He was brought to the Emergency Room. Here, the patient has been found to have congestive heart failure along with possible right lower lobe infiltrate and needing BiPAP because of hypercapnic respiratory failure and is being admitted to the hospitalist service.   The patient at baseline tends to go to work from Monday to Saturday. 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 He does go sit in his office although he does not really work. He also goes to church on Sundays without missing but he missed going to work 2 days last week and also missed church today morning although he did eat a full bowl of cereal in the morning per her per his caretaker. The patient has 24-hour caretakers with him and 1 of the caretakers mentioned that he wears 2 liters of oxygen at night and he uses the oxygen at times when he gets short of breath during the daytime but not all the time. Today, in the Emergency Room, the patient is needing to use oxygen. He did desat to 88% to 89% on oxygen.   PAST MEDICAL HISTORY: 1.  Colon polyps, diverticulosis.  2.  Status post pacemaker due to bradycardia.  3.  Macular degeneration.  4.  Trigeminal neuralgia.  5.  Hypertension.  6.   Diastolic congestive heart failure.   PAST SURGICAL HISTORY: Trigeminal neuralgia surgery and pacemaker implantation.   REVIEW OF SYSTEMS: Unobtainable secondary to the patient's encephalopathy, drowsiness.   ALLERGIES: PENICILLIN.   FAMILY HISTORY: One of his sisters is 17 and another is 11 years old. No diabetes, stroke or heart attack in the family.   SOCIAL HISTORY: The patient lives in [place]. Has 24-hour caretakers with him. Ambulates with a walker. He does not smoke. No alcohol. No illicit drugs.   CODE STATUS: Full code.  HOME MEDICATIONS ARE:  1.  Tegretol 200 mg oral 2 times a day.  2.  Hydrochlorothiazide 25 mg daily.  3.  Lasix 20 mg daily. 4.  Coreg 25 mg 2 times a day.  5.  Aspirin 81 mg daily.   PHYSICAL EXAMINATION: VITAL SIGNS: Temperature 98.4, pulse of 60, blood pressure of 131/75, saturating 88% on room air and 96% on 2 liters oxygen.  GENERAL: Moderately-built, elderly Caucasian male patient lying in bed in mild respiratory distress.  PSYCHIATRIC:  Is drowsy.  HEENT: Atraumatic, normocephalic. Oral mucosa dry and pink. External nose normal. No pallor. No icterus. Pupils bilaterally equal and reactive to light.  NECK: Supple. No thyromegaly or palpable lymph nodes.  Has JVD.  CARDIOVASCULAR: S1, S2. Has 2+ edema of the lower extremities.  RESPIRATORY: Good air entry on both sides, clear to auscultation.  GASTROINTESTINAL: Soft abdomen, nontender. Bowel sounds present. No hepatosplenomegaly palpable.  SKIN: Warm and dry. No  petechiae, rash, ulcers.  MUSCULOSKELETAL: No joint swelling, redness or pain of the large joints. Normal muscle tone.  NEUROLOGICAL: Moves all 4 extremities equally.   LABORATORY, DIAGNOSTIC AND RADIOLOGICAL DATA:   1.  Show glucose 210 with BNP of 22,000. BUN 36, creatinine 1.38, sodium 147, potassium 3.7, chloride 107, calcium 7. AST, ALT, alkaline phosphatase normal. Troponin 0.05 x 2. TSH 0.58. Tegretol level of 7.5.  2.  WBC 6.5,  hemoglobin 13.8, platelets of 100.  3.  Urinalysis shows no bacteria, no WBC.  4.  ABG shows pH of 7.36 with pCO2 of 78, pO2 of 78 on nasal cannula.  5.  EKG shows a paced rhythm.  6.  CT scan of the head without contrast shows no acute findings, atrophy, old right occipital craniotomy with some focal atrophy in the cerebellum.  7.  Chest, PA and lateral, shows chronic left lower lobe atelectasis and effusion lung with right lower lobe atelectasis and effusion and cardiomegaly. This x-ray has been reviewed personally and shows right lower lobe infiltrate versus atelectasis and some pleural effusion.   ASSESSMENT AND PLAN: 1.  Acute hypoxic hypercapnic respiratory failure in a patient with right lower lobe infiltrate and acute on chronic diastolic congestive heart failure.  2.  Right lower lobe infiltrate. Will start patient on antibiotics with Levaquin. Get sputum and blood cultures. The patient will be placed on bilevel positive airway pressure support secondary to hypercapnic respiratory failure causing acute encephalopathy.  3.  Acute on chronic diastolic congestive heart failure. The patient does have some pulmonary edema, has 2+ edema in lower extremities and jugular venous distension although his lungs sound clear. BNP is elevated at 20,000. We will put him on IV Lasix and monitor closely.  4.  Acute encephalopathy secondary to above. Will be followed. The patient is on fall precautions.  5.  Hypertension. Continue medications.  6.  Seizures. Continue Tegretol.  7.  Deep venous thrombosis prophylaxis with Lovenox.  8.  CODE STATUS: Full code.   TIME SPENT TODAY ON THIS CASE: Critical care time was 45 minutes.     ____________________________ Molinda BailiffSrikar R. Bawi Lakins, MD srs:cs D: 01/12/2014 18:34:16 ET T: 01/12/2014 20:29:56 ET JOB#: [id number]  cc: Wardell HeathSrikar R. Kariem Wolfson, MD, <Dictator> Orie FishermanSRIKAR R Raylin Diguglielmo MD ELECTRONICALLY SIGNED 01/13/2014 17:08

## 2015-03-03 ENCOUNTER — Encounter: Payer: Self-pay | Admitting: Cardiovascular Disease

## 2015-03-04 NOTE — Telephone Encounter (Signed)
Error. See note from 02/23/15 for details.

## 2015-10-20 IMAGING — CR DG CHEST 2V
1 series · 3 of 3 positions shown · non-contrast
Comparison: DG CHEST 1V PORT dated 10/31/2013; DG CHEST 1V PORT
dated 05/29/2009

CLINICAL DATA: Short of breath, fever

EXAM:
CHEST  2 VIEW

[Series 1: ap · 0.17mm/px · 3 of 3 slices shown]
[im 1/3]
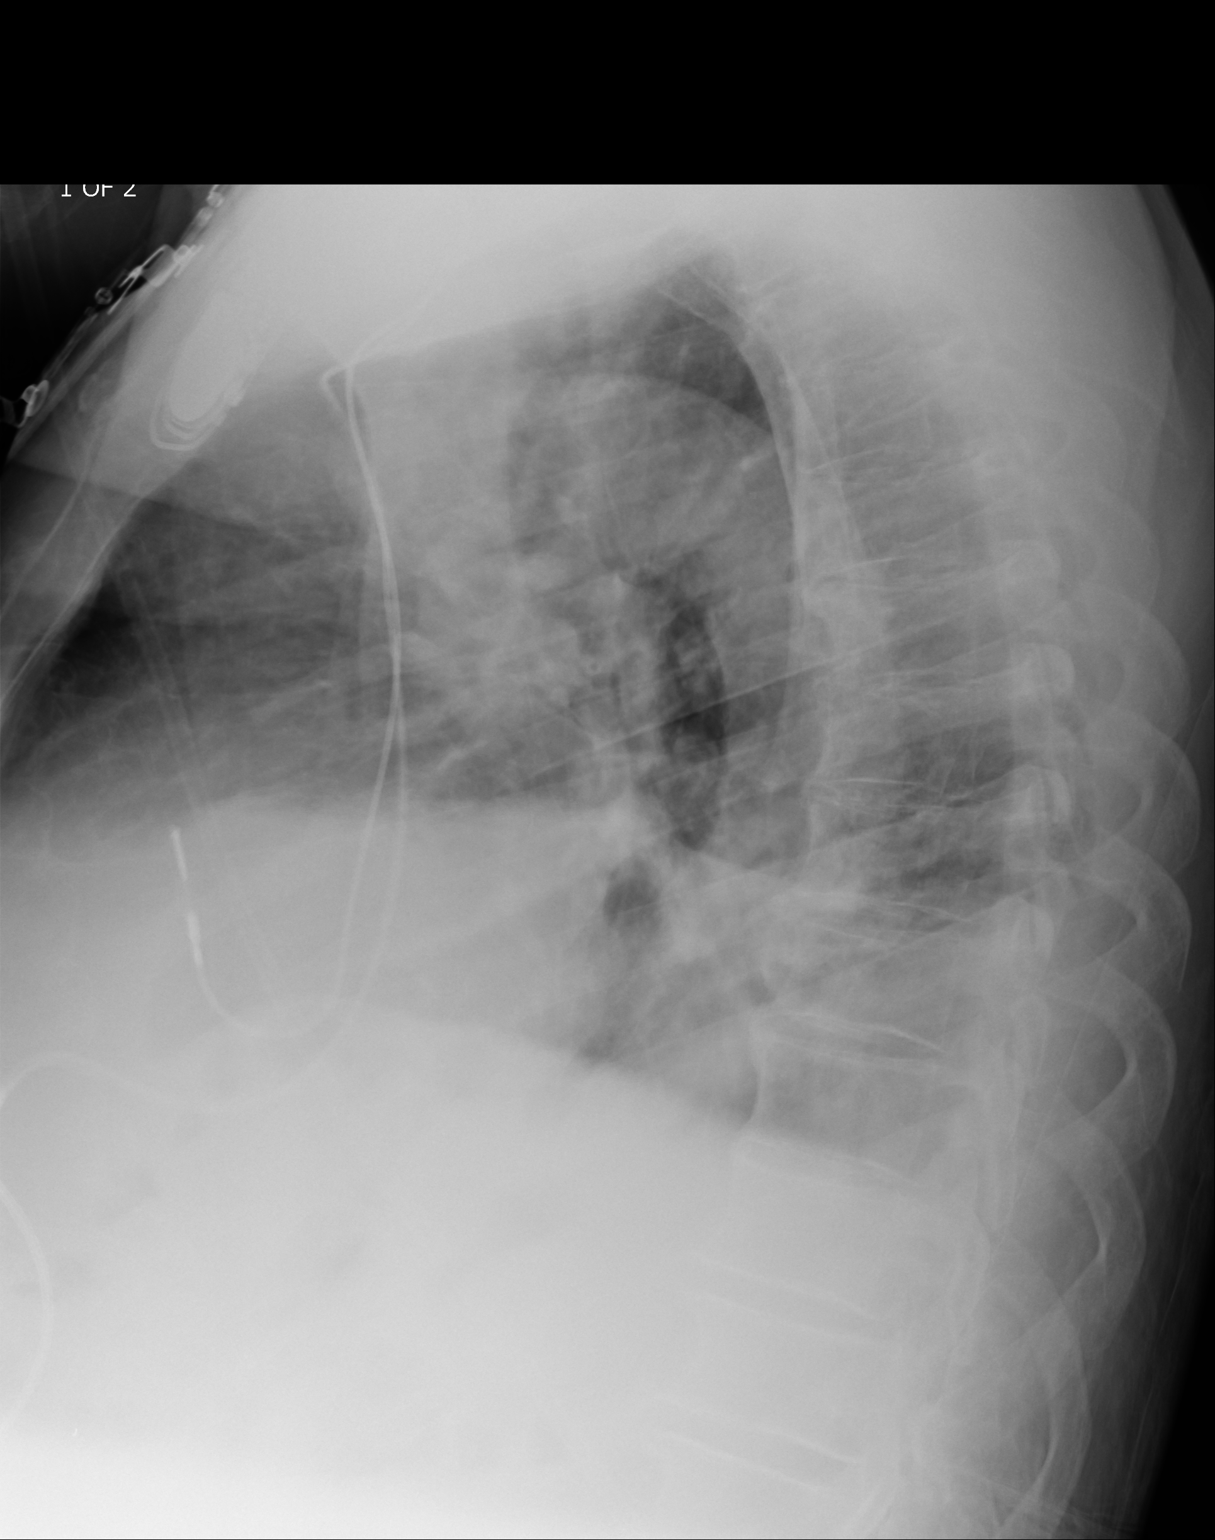
[im 2/3]
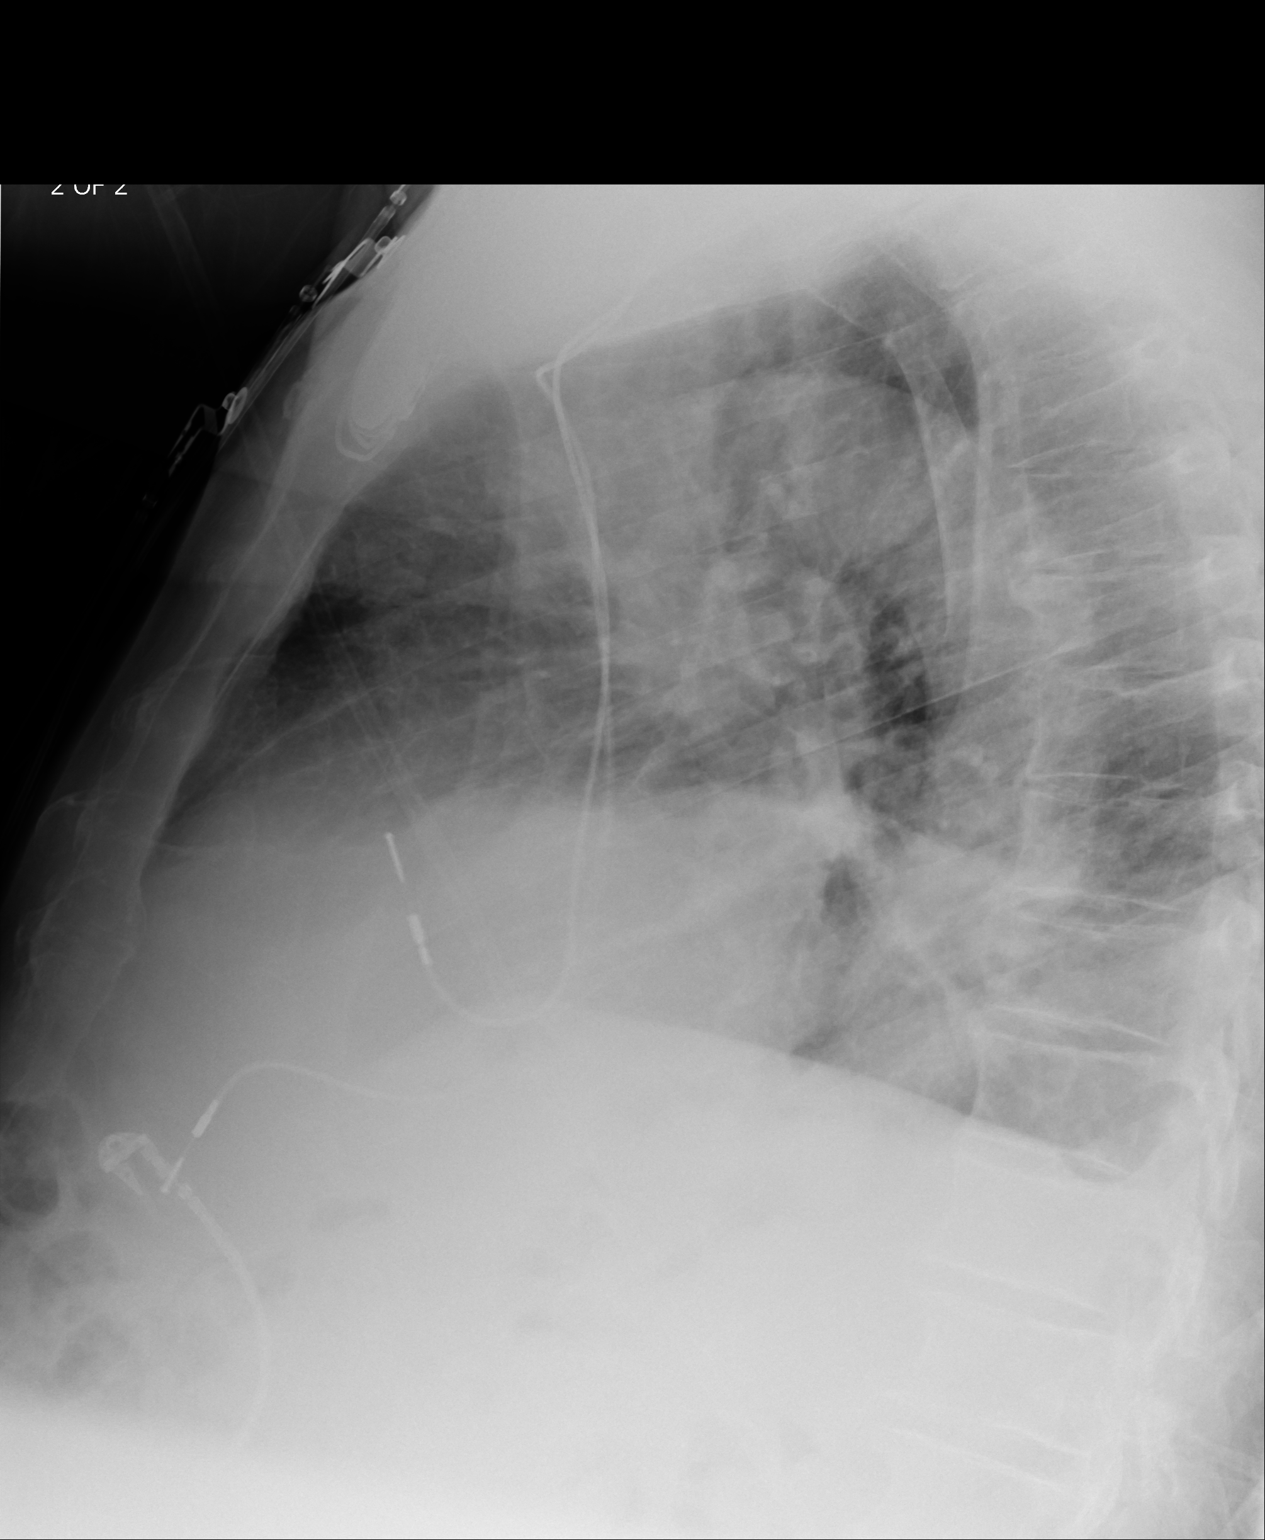
[im 3/3]
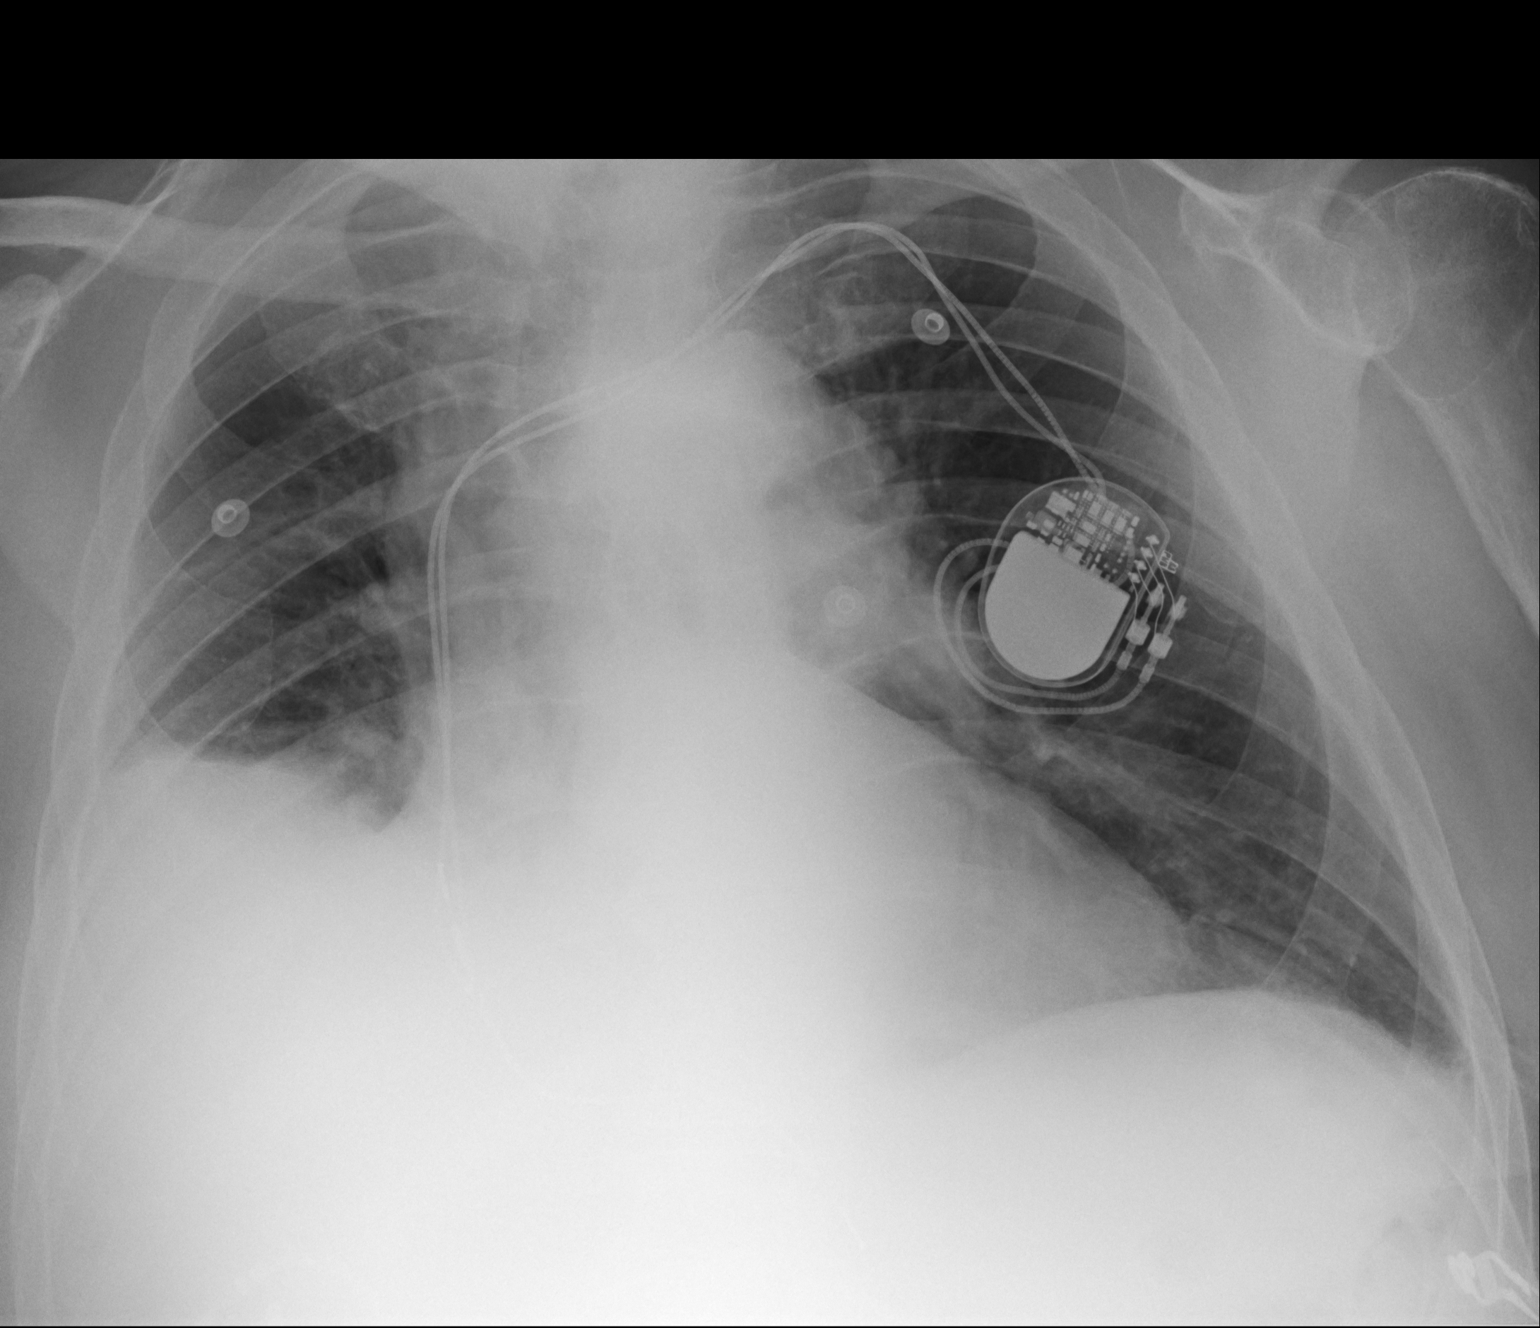

[3 of 3 positions shown; findings below may reference images not displayed]

FINDINGS: Left-sided pacemaker overlies stable enlarged cardiac silhouette.
There is chronic right lower lobe atelectasis and effusion. . No
pleural fluid. No pneumothorax. No pulmonary edema.
IMPRESSION: Chronic left lower lobe atelectasis and effusion is similar prior.

Cardiomegaly

## 2015-10-23 IMAGING — CR DG CHEST 2V
1 series · 2 of 2 positions shown · non-contrast
Comparison: DG CHEST POST BIOPSY - THORACENTESIS dated 01/14/2014

CLINICAL DATA: follow up basal pneumothorax post thoracentesis

EXAM:
CHEST  2 VIEW

[Series 2: x chest ap · 0.14mm/px · 2 of 2 slices shown]
[im 1/2]
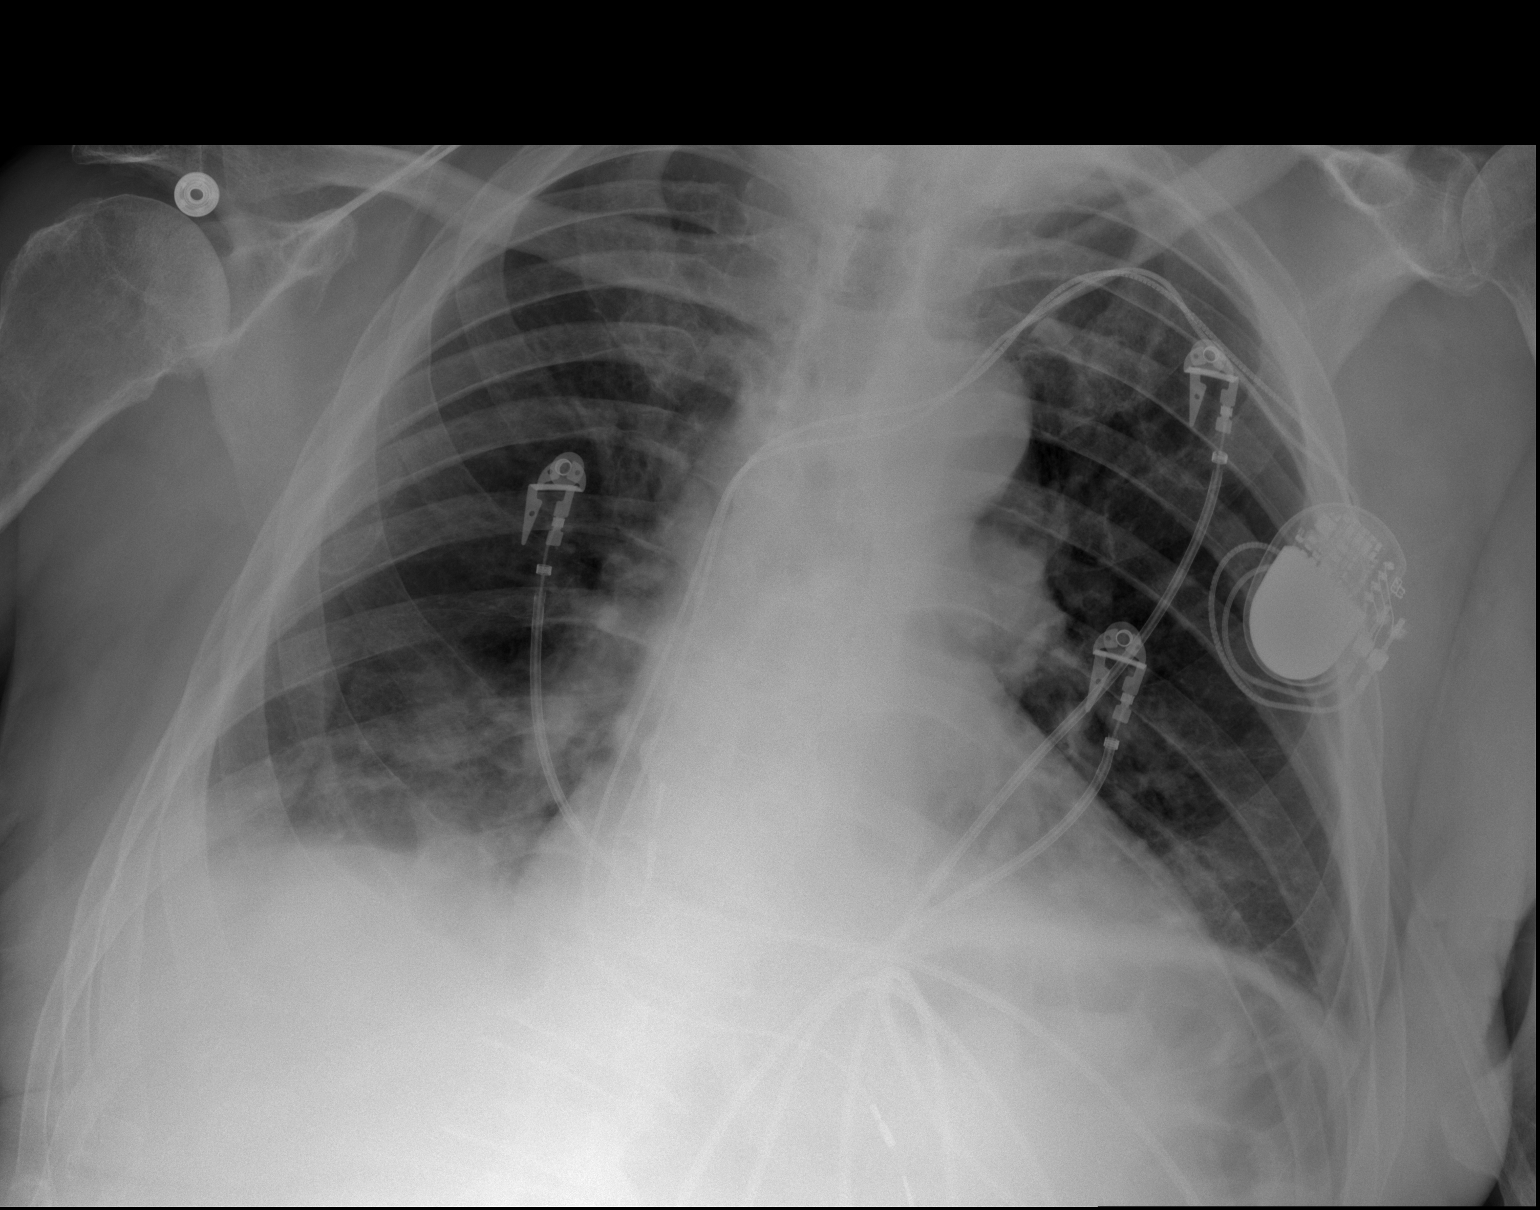
[im 2/2]
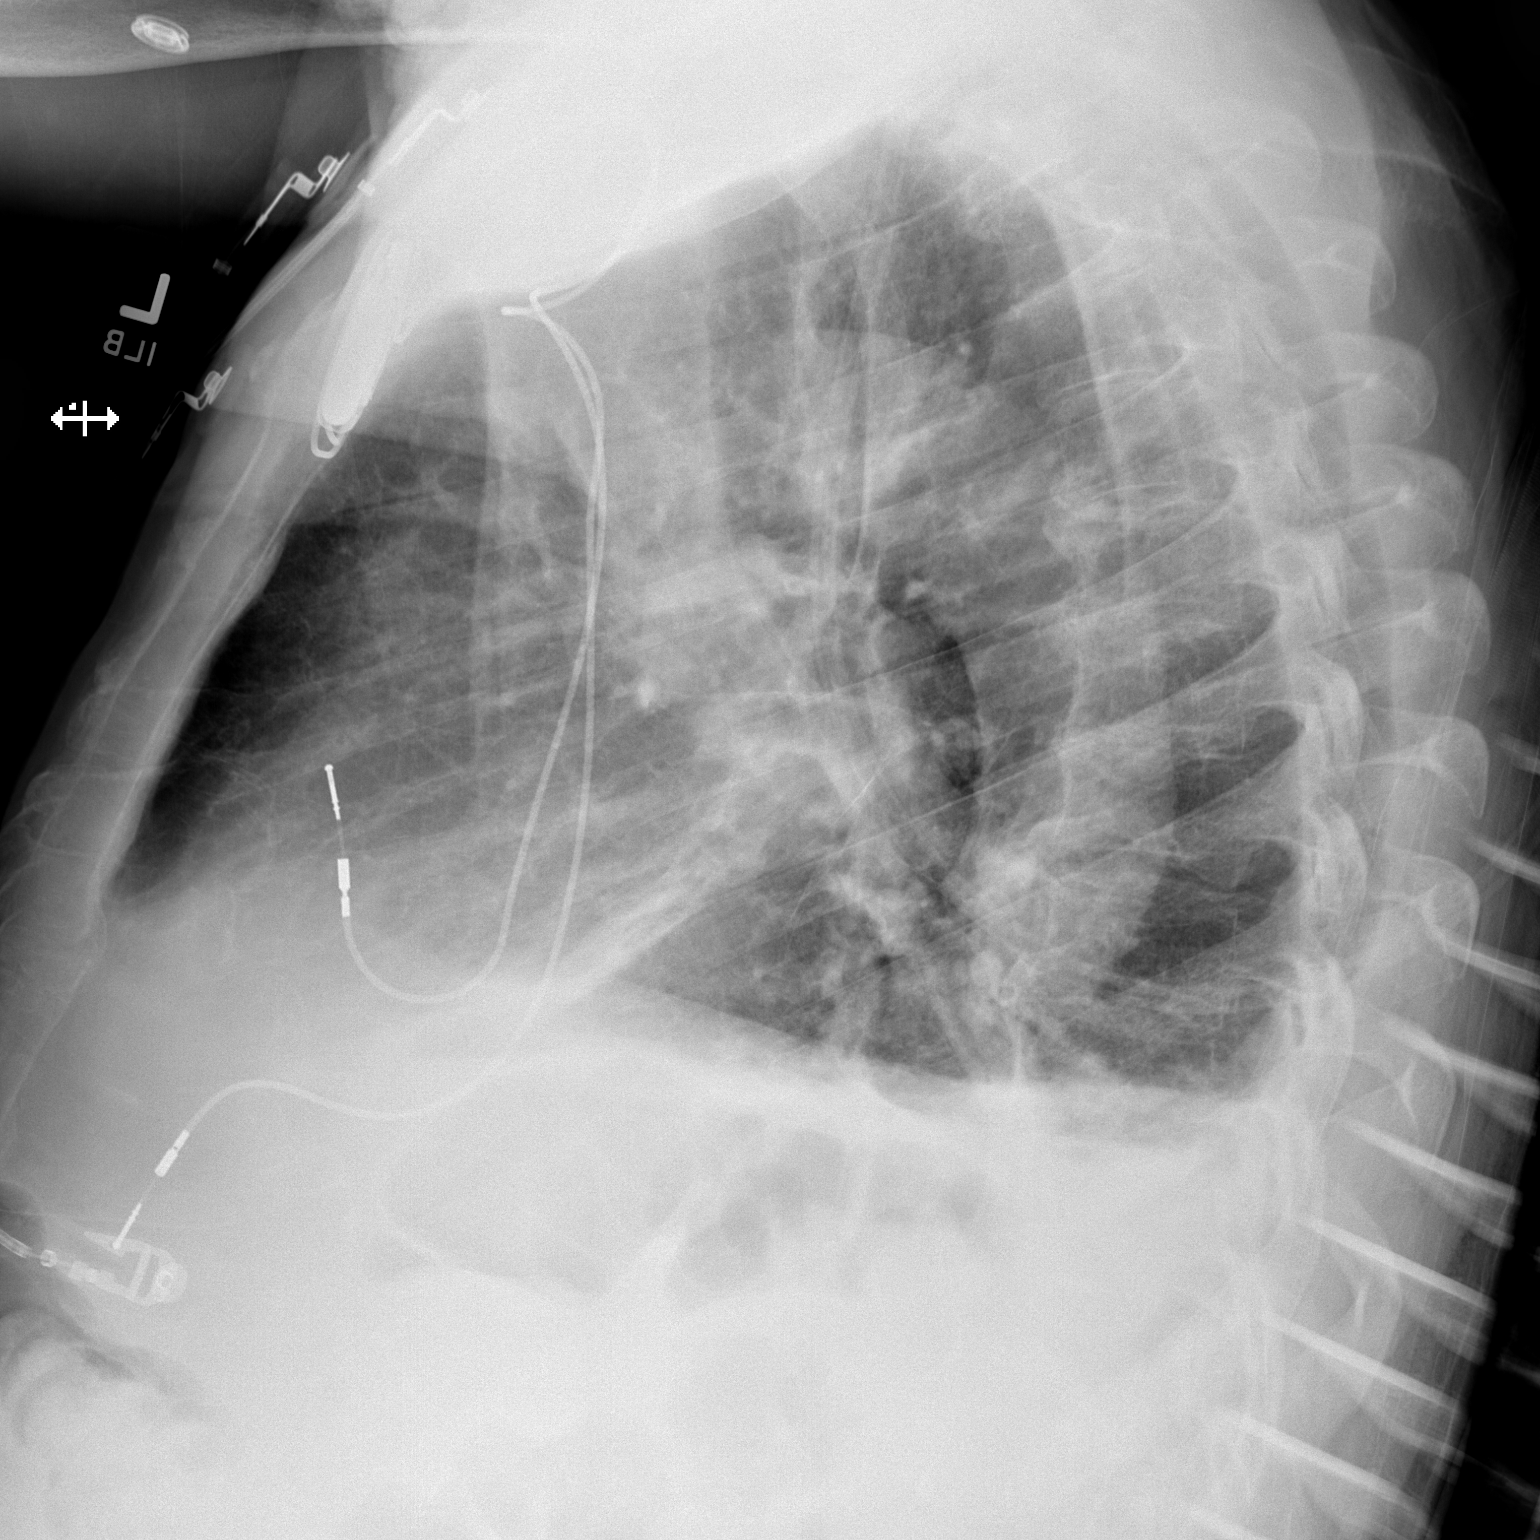

[2 of 2 positions shown; findings below may reference images not displayed]

FINDINGS: Low lung volumes. Cardiac silhouette is enlarged. A left chest wall
cardiac pacing unit. There is no evidence of a pneumothorax. There
is minimal blunting of the right costophrenic angle. Mild areas of
increased density within the lung bases. Degenerative changes within
the shoulders.
IMPRESSION: Low lung volumes. No evidence of a pneumothorax. Trace versus small
right pleural effusion

Atelectasis versus mild infiltrates within the lung bases.

## 2015-10-24 IMAGING — CR DG CHEST 2V
1 series · 3 of 3 positions shown · non-contrast
Comparison: DG CHEST 2V dated 01/15/2014;

CLINICAL DATA: Hypoxia overnight

EXAM:
CHEST  2 VIEW

[Series 4: x chest ap · 0.14mm/px · 3 of 3 slices shown]
[im 1/3]
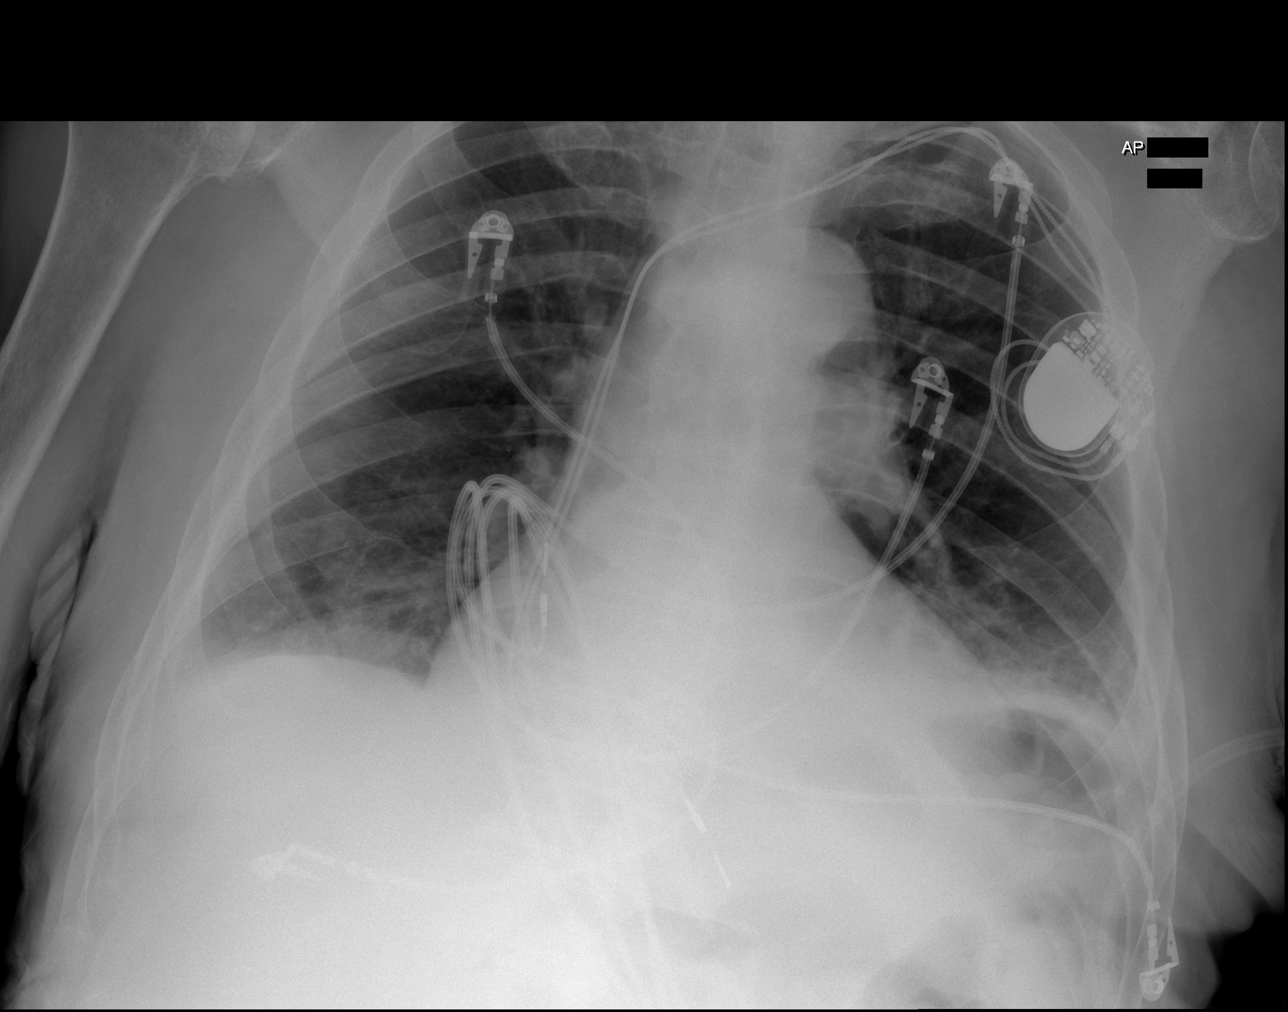
[im 2/3]
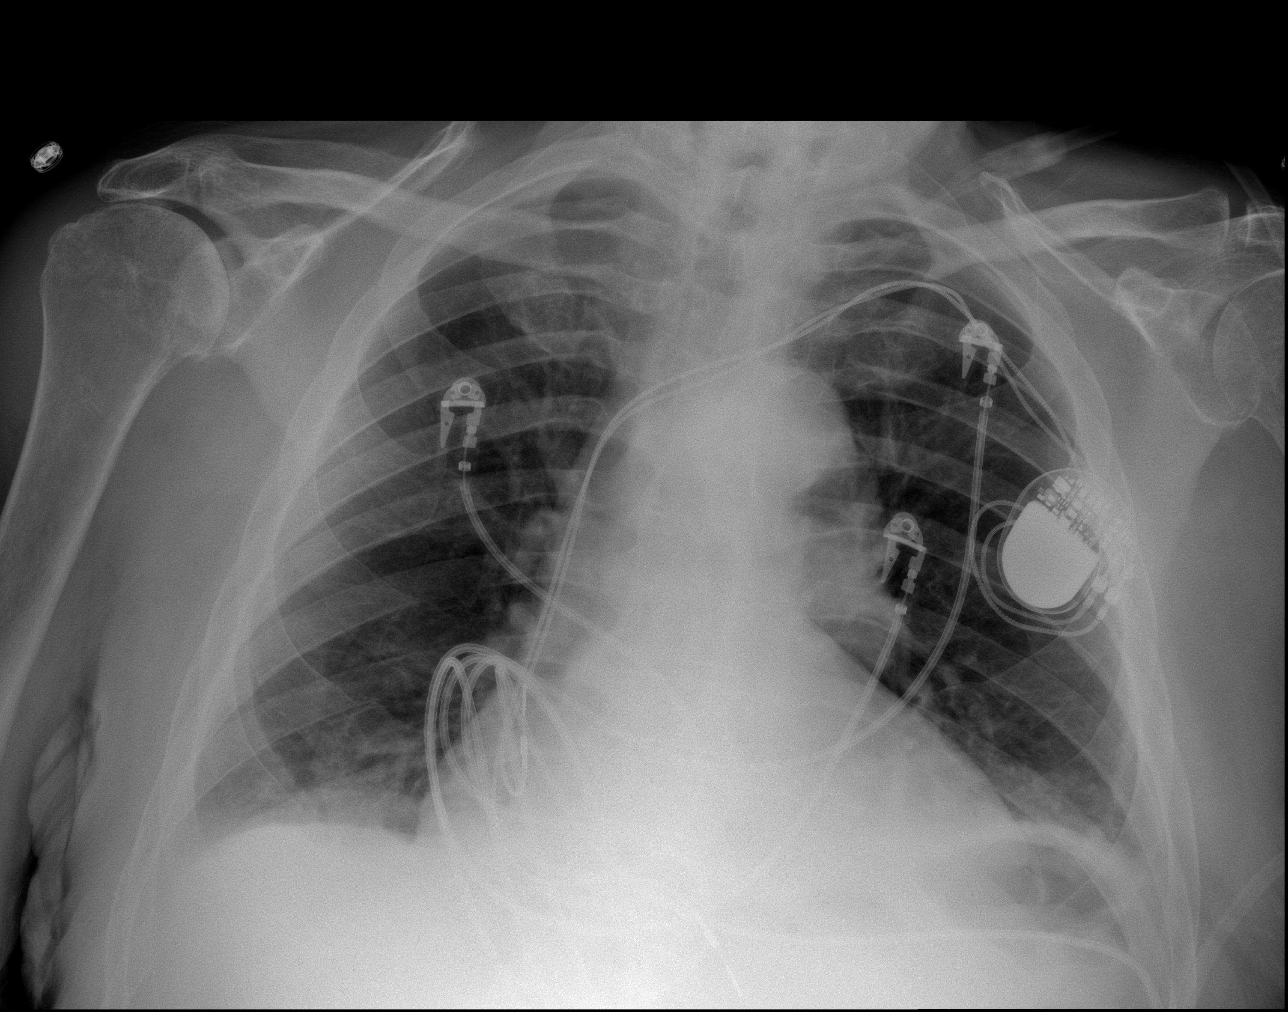
[im 3/3]
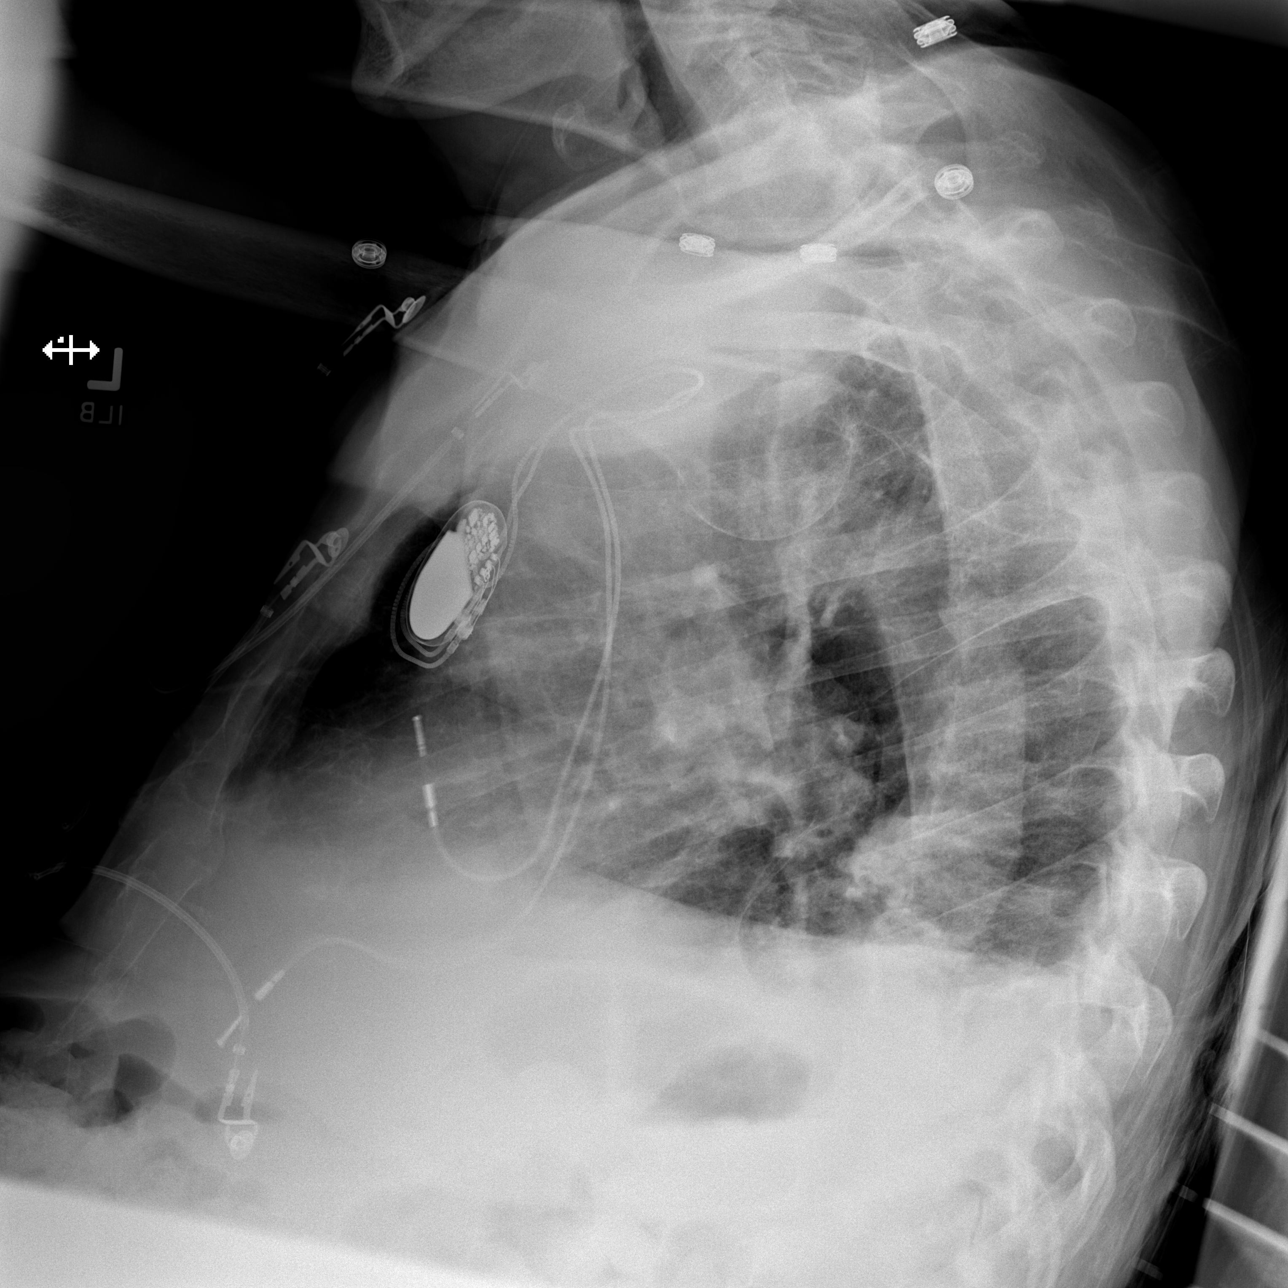

[3 of 3 positions shown; findings below may reference images not displayed]

DG CHEST POST BIOPSY -
THORACENTESIS dated 01/14/2014; CT CHEST W/O CM dated 01/13/2014
FINDINGS: Mild cardiac enlargement. Cardiac pacer in unchanged position. Hazy
bilateral lower lobe densities suggests the presence of small
effusions, likely with mild underlying atelectasis. No evidence of
pulmonary edema currently.
IMPRESSION: Small bilateral pleural effusions with mild bibasilar atelectasis

## 2015-10-25 IMAGING — CR DG CHEST 1V PORT
1 series · 2 of 2 positions shown · non-contrast
Comparison: DG CHEST 2V dated 01/16/2014

CLINICAL DATA: Chest congestion

EXAM:
PORTABLE CHEST - 1 VIEW

[Series 1: ap · 0.17mm/px · 2 of 2 slices shown]
[im 1/2]
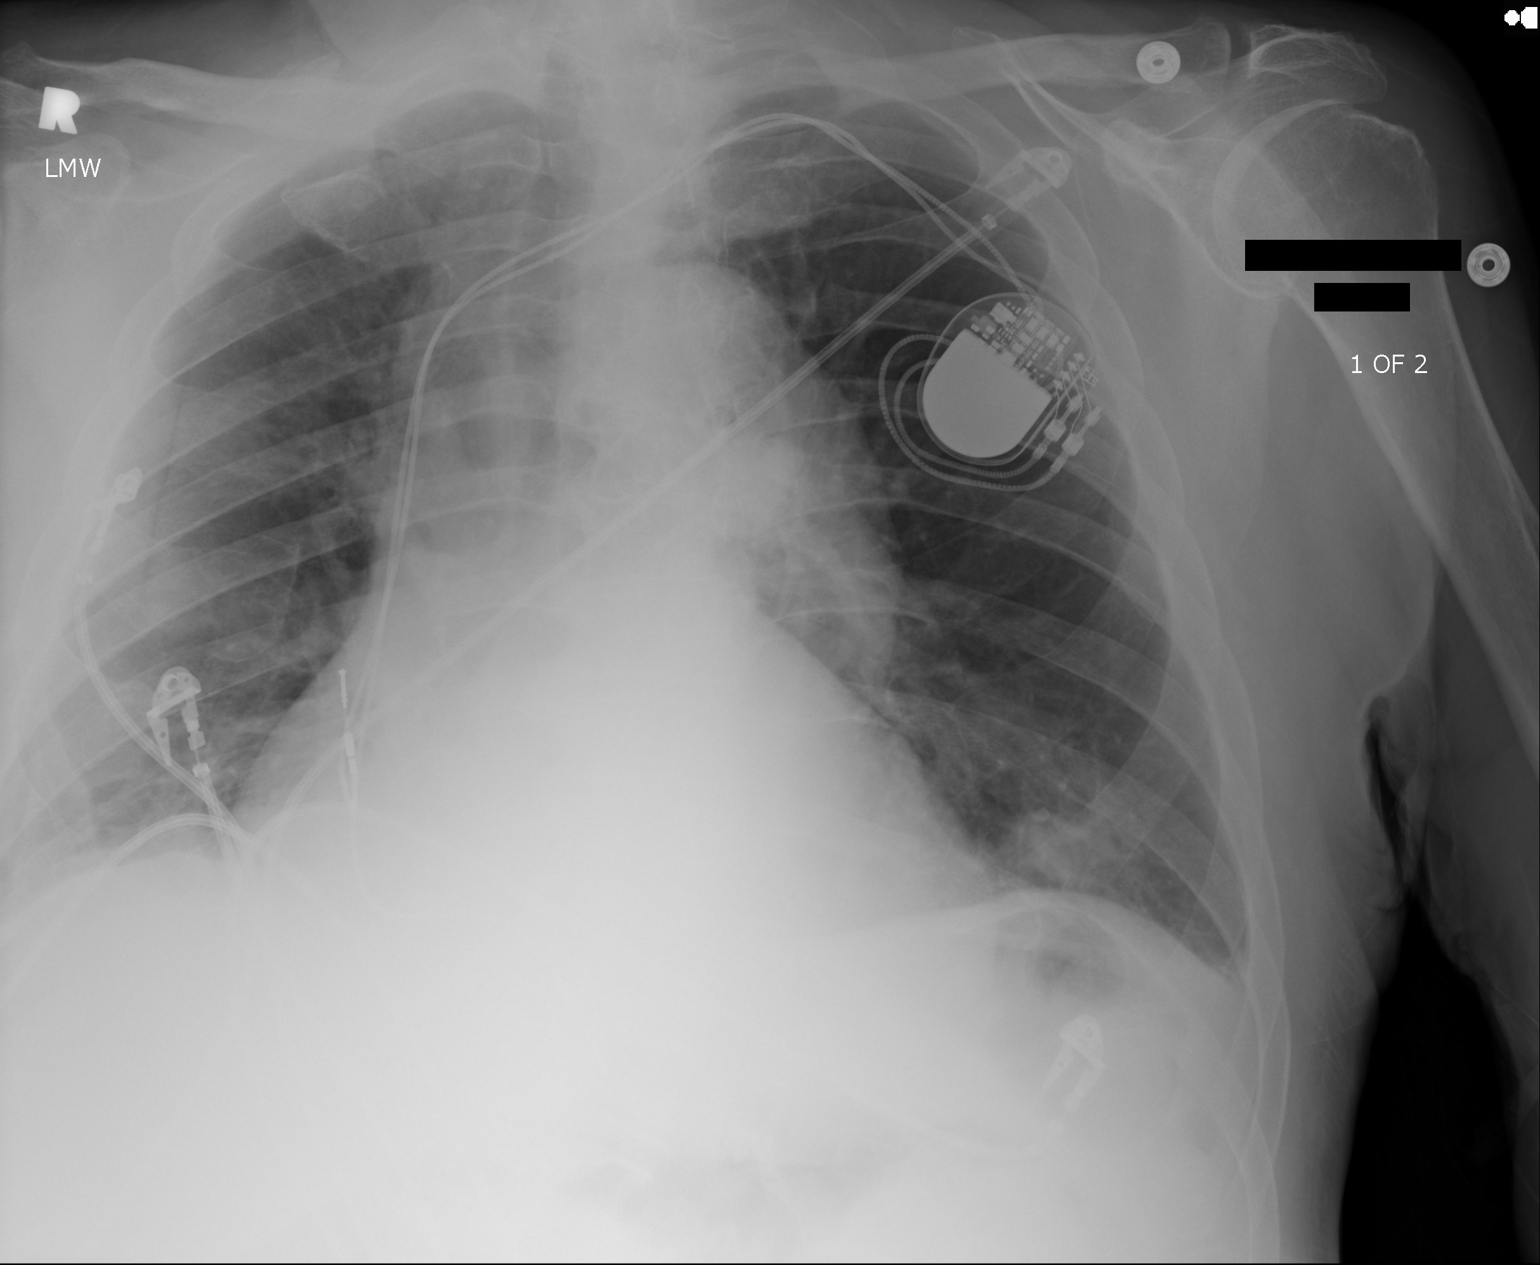
[im 2/2]
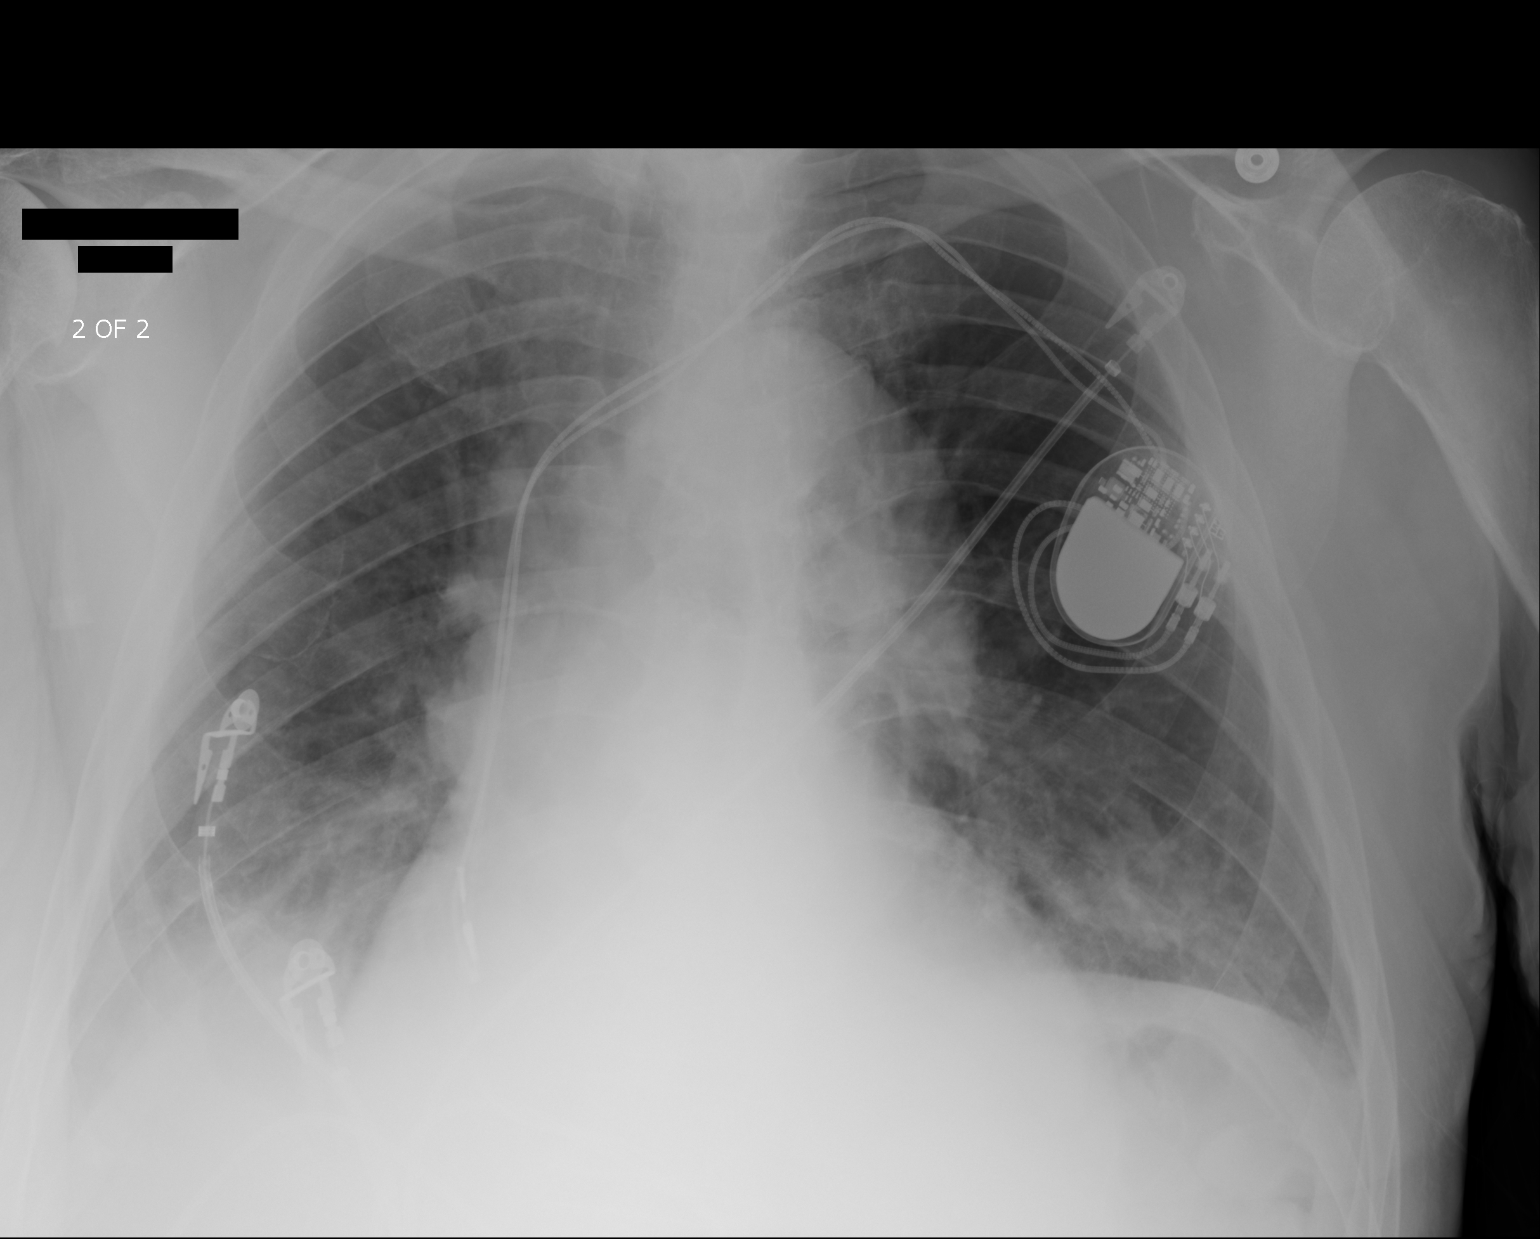

[2 of 2 positions shown; findings below may reference images not displayed]

FINDINGS: Cardiac pacemaker. Shallow inspiration. Increased heart size with
normal pulmonary vascularity. Ectatic and tortuous aorta.
Atelectasis or infiltration in both lung bases similar to previous
study. Probable small bilateral pleural effusions. No pneumothorax.
IMPRESSION: Shallow inspiration with atelectasis or infiltration in both lung
bases.
